# Patient Record
Sex: Male | Born: 1990 | Race: Black or African American | Hispanic: No | Marital: Single | Smoking: Current every day smoker
Health system: Southern US, Community
[De-identification: ages and names within clinical notes are randomized; demographics above are authoritative.]

## PROBLEM LIST (undated history)

## (undated) HISTORY — PX: APPENDECTOMY: SHX54

---

## 2011-02-08 ENCOUNTER — Emergency Department (HOSPITAL_COMMUNITY)
Admission: EM | Admit: 2011-02-08 | Discharge: 2011-02-09 | Disposition: A | Discharge status: Home / Self Care | Payer: Self-pay | Attending: Emergency Medicine | Admitting: Emergency Medicine

## 2011-02-08 DIAGNOSIS — IMO0002 Reserved for concepts with insufficient information to code with codable children: Secondary | ICD-10-CM | POA: Insufficient documentation

## 2011-02-08 DIAGNOSIS — S61409A Unspecified open wound of unspecified hand, initial encounter: Secondary | ICD-10-CM | POA: Insufficient documentation

## 2011-02-08 DIAGNOSIS — W268XXA Contact with other sharp object(s), not elsewhere classified, initial encounter: Secondary | ICD-10-CM | POA: Insufficient documentation

## 2012-05-23 ENCOUNTER — Ambulatory Visit (INDEPENDENT_AMBULATORY_CARE_PROVIDER_SITE_OTHER): Payer: 59 | Admitting: General Surgery

## 2013-04-10 ENCOUNTER — Emergency Department (HOSPITAL_COMMUNITY)
Admission: EM | Admit: 2013-04-10 | Discharge: 2013-04-10 | Disposition: A | Discharge status: Home / Self Care | Payer: 59 | Attending: Emergency Medicine | Admitting: Emergency Medicine

## 2013-04-10 ENCOUNTER — Encounter (HOSPITAL_COMMUNITY): Payer: Self-pay | Admitting: Emergency Medicine

## 2013-04-10 DIAGNOSIS — D1739 Benign lipomatous neoplasm of skin and subcutaneous tissue of other sites: Secondary | ICD-10-CM | POA: Insufficient documentation

## 2013-04-10 DIAGNOSIS — D1730 Benign lipomatous neoplasm of skin and subcutaneous tissue of unspecified sites: Secondary | ICD-10-CM

## 2013-04-10 NOTE — ED Provider Notes (Signed)
CSN: [id number]     Arrival date & time 04/10/13  1304 History  This chart was scribed for Nathan Finner, PA working with Ward Givens, MD by Quintella Reichert, ED Scribe. This patient was seen in room WTR7/WTR7 and the patient's care was started at 3:19 PM.    Chief Complaint  Patient presents with  . Cyst    The history is provided by the patient. No language interpreter was used.    HPI Comments: Nathan Moreno is a 22 y.o. male with no chronic medical conditions who presents to the Emergency Department complaining of a cyst near his left collar bone that his been present for one year.  The cyst is described as a dime-sized soft bump.  It is not painful.  He denies weight loss, fever, pain on swallowing, or any other associated symptoms.  There are no aggravating or alleviating factors.   History reviewed. No pertinent past medical history.   History reviewed. No pertinent past surgical history.   No family history on file.   History  Substance Use Topics  . Smoking status: Not on file  . Smokeless tobacco: Not on file  . Alcohol Use: Not on file     Review of Systems  Constitutional: Negative for fever and unexpected weight change.  HENT: Negative for sore throat and trouble swallowing.   Musculoskeletal:       Cyst  All other systems reviewed and are negative.      Allergies  Review of patient's allergies indicates no known allergies.  Home Medications  No current outpatient prescriptions on file.  BP 114/69  Pulse 65  Temp(Src) 97.5 F (36.4 C) (Oral)  Resp 20  SpO2 100%  Physical Exam  Nursing note and vitals reviewed. Constitutional: He is oriented to person, place, and time. He appears well-developed and well-nourished.  HENT:  Head: Normocephalic and atraumatic.  Eyes: EOM are normal.  Neck: Normal range of motion.  Cardiovascular: Normal rate.   Pulmonary/Chest: Effort normal.  Musculoskeletal: Normal range of motion.  1-cm mobile lesion  over left clavicle  Neurological: He is alert and oriented to person, place, and time.  Skin: Skin is warm and dry.  Psychiatric: He has a normal mood and affect. His behavior is normal.    ED Course  Procedures (including critical care time)  DIAGNOSTIC STUDIES: Oxygen Saturation is 100% on room air, normal by my interpretation.    COORDINATION OF CARE: 3:22 PM-Informed pt that symptoms are not cause for concern and no treatment is indicated.  Advised return precautions.  Pt expressed understanding.   Labs Reviewed - No data to display No results found. 1. Lipoma of skin     MDM  Lump on pt's proximal clavicle characteristic for lipoma.  Not concerned for infection, low concern for malignant mass.  Advised pt to watch for any changes in mass including pain, redness, or size.  Will discharge pt home and have him f/u with [facility] info provided. Return precautions given. Pt verbalized understanding and agreement with tx plan. Vitals: unremarkable. Discharged in stable condition.    I personally performed the services described in this documentation, which was scribed in my presence. The recorded information has been reviewed and is accurate.   Nathan Finner, PA-C 04/13/13 1622

## 2013-04-10 NOTE — ED Notes (Signed)
Pt has had a lump/ knot to lt side of collar bone for the past year now. Denies any pain states that since he was here with someone he wanted to have it looked at. Knot is soft and is dime size.

## 2013-04-15 NOTE — ED Provider Notes (Signed)
Medical screening examination/treatment/procedure(s) were performed by non-physician practitioner and as supervising physician I was immediately available for consultation/collaboration. Marjoria Mancillas, MD, FACEP   Donney Caraveo L Bianey Tesoro, MD 04/15/13 1509 

## 2013-09-13 ENCOUNTER — Encounter (HOSPITAL_BASED_OUTPATIENT_CLINIC_OR_DEPARTMENT_OTHER): Payer: Self-pay | Admitting: Emergency Medicine

## 2013-09-13 ENCOUNTER — Emergency Department (HOSPITAL_BASED_OUTPATIENT_CLINIC_OR_DEPARTMENT_OTHER)
Admission: EM | Admit: 2013-09-13 | Discharge: 2013-09-13 | Disposition: A | Discharge status: Home / Self Care | Payer: 59 | Attending: Emergency Medicine | Admitting: Emergency Medicine

## 2013-09-13 ENCOUNTER — Emergency Department (HOSPITAL_BASED_OUTPATIENT_CLINIC_OR_DEPARTMENT_OTHER): Payer: 59

## 2013-09-13 DIAGNOSIS — S62309A Unspecified fracture of unspecified metacarpal bone, initial encounter for closed fracture: Secondary | ICD-10-CM | POA: Insufficient documentation

## 2013-09-13 MED ORDER — HYDROCODONE-ACETAMINOPHEN 5-325 MG PO TABS: 1.0000 | ORAL_TABLET | Freq: Four times a day (QID) | ORAL | Status: DC | PRN

## 2013-09-13 MED ORDER — IBUPROFEN 800 MG PO TABS: 800.0000 mg | ORAL_TABLET | Freq: Three times a day (TID) | ORAL | Status: DC | PRN

## 2013-09-13 MED ORDER — IBUPROFEN 800 MG PO TABS
800.0000 mg | ORAL_TABLET | Freq: Once | ORAL | Status: AC
  Administered 2013-09-13: 800 mg via ORAL
  Filled 2013-09-13: qty 1
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
## 2013-09-13 NOTE — ED Notes (Signed)
Patient transported to X-ray via stretcher per tech. 
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
## 2013-09-13 NOTE — Discharge Instructions (Signed)
Cast or Splint Care Casts and splints support injured limbs and keep bones from moving while they heal.  HOME CARE  Keep the cast or splint uncovered during the drying period.  A plaster cast can take 24 to 48 hours to dry.  A fiberglass cast will dry in less than 1 hour.  Do not rest the cast on anything harder than a pillow for 24 hours.  Do not put weight on your injured limb. Do not put pressure on the cast. Wait for your doctor's approval.  Keep the cast or splint dry.  Cover the cast or splint with a plastic bag during baths or wet weather.  If you have a cast over your chest and belly (trunk), take sponge baths until the cast is taken off.  If your cast gets wet, dry it with a towel or blow dryer. Use the cool setting on the blow dryer.  Keep your cast or splint clean. Wash a dirty cast with a damp cloth.  Do not put any objects under your cast or splint.  Do not scratch the skin under the cast with an object. If itching is a problem, use a blow dryer on a cool setting over the itchy area.  Do not trim or cut your cast.  Do not take out the padding from inside your cast.  Exercise your joints near the cast as told by your doctor.  Raise (elevate) your injured limb on 1 or 2 pillows for the first 1 to 3 days. GET HELP IF:  Your cast or splint cracks.  Your cast or splint is too tight or too loose.  You itch badly under the cast.  Your cast gets wet or has a soft spot.  You have a bad smell coming from the cast.  You get an object stuck under the cast.  Your skin around the cast becomes red or sore.  You have new or more pain after the cast is put on. GET HELP RIGHT AWAY IF:  You have fluid leaking through the cast.  You cannot move your fingers or toes.  Your fingers or toes turn blue or white or are cool, painful, or puffy (swollen).  You have tingling or lose feeling (numbness) around the injured area.  You have bad pain or pressure under the  cast.  You have trouble breathing or have shortness of breath.  You have chest pain. Document Released: 12/03/2010 Document Revised: 04/05/2013 Document Reviewed: 02/09/2013 [facility] Patient Information 2014 [place].  Hand Fracture Your caregiver has diagnosed you with a fractured (broken) bone in your hand. If the bones are in good position and the hand is properly immobilized and rested, these injuries will usually heal in 3 to 6 weeks. A cast, splint, or bulky bandage is usually applied to keep the fracture site from moving. Do not remove the splint or cast until your caregiver approves. If the fracture is unstable or the bones are not aligned properly, surgery may be needed. Keep your hand raised (elevated) above the level of your heart as much as possible for the next 2 to 3 days until the swelling and pain are better. Apply ice packs for 15-20 minutes every 3 to 4 hours to help control the pain and swelling. See your caregiver or an orthopedic specialist as directed for follow-up care to make sure the fracture is beginning to heal properly. SEEK IMMEDIATE MEDICAL CARE IF:   You notice your fingers are cold, numb, crooked, or the pain  of your injury is severe.  You are not improving or seem to be getting worse.  You have questions or concerns. Document Released: 09/10/2004 Document Revised: 10/26/2011 Document Reviewed: 01/29/2009 [facility] Patient Information 2014 [place].

## 2013-09-13 NOTE — ED Notes (Signed)
MD at bedside. 

## 2015-04-08 ENCOUNTER — Encounter (HOSPITAL_BASED_OUTPATIENT_CLINIC_OR_DEPARTMENT_OTHER): Payer: Self-pay

## 2015-04-08 ENCOUNTER — Emergency Department (HOSPITAL_BASED_OUTPATIENT_CLINIC_OR_DEPARTMENT_OTHER)
Admission: EM | Admit: 2015-04-08 | Discharge: 2015-04-08 | Disposition: A | Discharge status: Home / Self Care | Payer: 59 | Attending: Emergency Medicine | Admitting: Emergency Medicine

## 2015-04-08 DIAGNOSIS — B349 Viral infection, unspecified: Secondary | ICD-10-CM | POA: Diagnosis not present

## 2015-04-08 DIAGNOSIS — R05 Cough: Secondary | ICD-10-CM | POA: Diagnosis present

## 2015-04-08 MED ORDER — BENZONATATE 100 MG PO CAPS: 200.0000 mg | ORAL_CAPSULE | Freq: Two times a day (BID) | ORAL | Status: DC | PRN

## 2015-04-08 MED ORDER — CETIRIZINE-PSEUDOEPHEDRINE ER 5-120 MG PO TB12: 1.0000 | ORAL_TABLET | Freq: Two times a day (BID) | ORAL | Status: DC

## 2015-04-08 NOTE — ED Notes (Signed)
Pt verbalizes understanding of d/c instructions and denies any further needs at this time. 

## 2015-04-08 NOTE — ED Notes (Signed)
C/o coughing x 2 weeks

## 2015-04-08 NOTE — ED Provider Notes (Signed)
CSN: [id number]     Arrival date & time 04/08/15  1429 History   First MD Initiated Contact with Patient 04/08/15 1454     Chief Complaint  Patient presents with  . Cough     (Consider location/radiation/quality/duration/timing/severity/associated sxs/prior Treatment) HPI Comments: Pt is a 24 yo male who presents to the ED with complaint of cough, onset 2 weeks. Pt endorses frontal HA, rhinorrhea, nausea, diarrhea. He notes he has had an intermittent productive cough with green/yellow sputum. Denies fever, visual changes, sore throat, dyspnea, wheezing, CP, abdominal pain, N/V, weakness, numbness, tingling. Pt reports that he has been taking multiple OTC meds and trying home remedies without relief. He notes he went to an urgent care clinic last week and was given a prescription (pt cant remember what the med was) which caused him to have diarrhea and as a result he stopped taking the medication. Denies any recent sick contacts.   Patient is a 24 y.o. male presenting with cough.  Cough Associated symptoms: headaches and rhinorrhea     History reviewed. No pertinent past medical history. Past Surgical History  Procedure Laterality Date  . Appendectomy     No family history on file. Social History  Substance Use Topics  . Smoking status: Never Smoker   . Smokeless tobacco: None  . Alcohol Use: Yes    Review of Systems  HENT: Positive for rhinorrhea.   Respiratory: Positive for cough.   Gastrointestinal: Positive for nausea and diarrhea.  Neurological: Positive for headaches.  All other systems reviewed and are negative.     Allergies  Review of patient's allergies indicates no known allergies.  Home Medications   Prior to Admission medications   Not on File   BP 125/78 mmHg  Pulse 54  Temp(Src) 98.7 F (37.1 C) (Oral)  Resp 16  Ht 5\' 5"  (1.651 m)  Wt 190 lb (86.183 kg)  BMI 31.62 kg/m2  SpO2 100% Physical Exam  Constitutional: He is oriented to person, place,  and time. He appears well-developed and well-nourished.  Non-toxic appearing.  HENT:  Head: Normocephalic and atraumatic.  Right Ear: Tympanic membrane and external ear normal.  Left Ear: Tympanic membrane and external ear normal.  Nose: Nose normal. Right sinus exhibits no maxillary sinus tenderness and no frontal sinus tenderness. Left sinus exhibits no maxillary sinus tenderness and no frontal sinus tenderness.  Mouth/Throat: Oropharynx is clear and moist. No oropharyngeal exudate.  Eyes: Conjunctivae and EOM are normal. Pupils are equal, round, and reactive to light. Right eye exhibits no discharge. Left eye exhibits no discharge. No scleral icterus.  Neck: Normal range of motion. Neck supple.  Cardiovascular: Normal rate, regular rhythm, normal heart sounds and intact distal pulses.   Pulmonary/Chest: Effort normal and breath sounds normal. He has no wheezes. He has no rales. He exhibits no tenderness.  Abdominal: Soft. Bowel sounds are normal. He exhibits no mass. There is no tenderness. There is no rebound and no guarding.  Musculoskeletal: Normal range of motion. He exhibits no edema.  Lymphadenopathy:    He has no cervical adenopathy.  Neurological: He is alert and oriented to person, place, and time.  Skin: Skin is warm and dry.  Vitals reviewed.   ED Course  Procedures (including critical care time) Labs Review Labs Reviewed - No data to display  Imaging Review No results found. I have personally reviewed and evaluated these images and lab results as part of my medical decision-making.  Filed Vitals:   04/08/15 1436  BP: 125/78  Pulse: 54  Temp: 98.7 F (37.1 C)  Resp: 16    Meds given in ED:  Medications - No data to display  Discharge Medication List as of 04/08/2015  3:49 PM    START taking these medications   Details  benzonatate (TESSALON) 100 MG capsule Take 2 capsules (200 mg total) by mouth 2 (two) times daily as needed for cough., Starting 04/08/2015,  Until Discontinued, Print    cetirizine-pseudoephedrine (ZYRTEC-D) 5-120 MG per tablet Take 1 tablet by mouth 2 (two) times daily., Starting 04/08/2015, Until Discontinued, Print         MDM   Final diagnoses:  Viral syndrome    Pt presents with cough, HA, nausea and diarrhea. HA, nausea and diarrhea have resolved. Vitals stable, no fever. No sinus tenderness, no postnasal drainage noted on exam. Lungs clear bilaterally, I do not feel the need to order a xray at this time. Symptoms likely due to viral illness. I will give pt rx for Tessalon and pseudoephedrine for sxs. Discussed plan for d/c with pt. Pt advised to take medications as prescribed and to return to ED if sxs worsen. Pt agrees.    Chesley Noon Lyons, [place] 04/08/15 1555  Carmin Muskrat, MD 04/10/15 [phone]

## 2015-04-11 ENCOUNTER — Encounter (HOSPITAL_COMMUNITY): Payer: Self-pay | Admitting: *Deleted

## 2015-04-11 ENCOUNTER — Emergency Department (HOSPITAL_COMMUNITY)
Admission: EM | Admit: 2015-04-11 | Discharge: 2015-04-11 | Disposition: A | Discharge status: Home / Self Care | Payer: 59 | Attending: Emergency Medicine | Admitting: Emergency Medicine

## 2015-04-11 DIAGNOSIS — Z79899 Other long term (current) drug therapy: Secondary | ICD-10-CM | POA: Diagnosis not present

## 2015-04-11 DIAGNOSIS — H578 Other specified disorders of eye and adnexa: Secondary | ICD-10-CM | POA: Diagnosis present

## 2015-04-11 DIAGNOSIS — Z720 Tobacco use: Secondary | ICD-10-CM | POA: Insufficient documentation

## 2015-04-11 DIAGNOSIS — H01005 Unspecified blepharitis left lower eyelid: Secondary | ICD-10-CM

## 2015-04-11 MED ORDER — ERYTHROMYCIN 5 MG/GM OP OINT: 1.0000 "application " | TOPICAL_OINTMENT | Freq: Four times a day (QID) | OPHTHALMIC | Status: DC

## 2015-04-11 NOTE — ED Notes (Signed)
Pt c/o eye swelling since Tues; pt states that his eye was itching on Tues but none since; pt with swelling to left lower lid; no redness noted

## 2015-04-11 NOTE — ED Provider Notes (Signed)
CSN: [id number]     Arrival date & time 04/11/15  2142 History  This chart was scribed for Antonietta Breach, PA-C, working with Orlie Dakin, MD by Steva Colder, ED Scribe. The patient was seen in room WTR8/WTR8 at 10:19 PM.     Chief Complaint  Patient presents with  . Stye    The history is provided by the patient. No language interpreter was used.    HPI Comments: Nathan Moreno is a 24 y.o. male who presents to the Emergency Department complaining of left lower lid eye swelling onset 3 AM this morning. Pt reports that his eye swelling is with associated left eye itching.  Pt notes that he is still able to see but it is decreased due to the lower left eye swelling. He states that he is having associated symptoms of watery left eye and left eye pain. He states that he has tried OTC visine, OTC stye medications, and warm compresses for the relief for his symptoms. He denies eye redness, left eye drainage, vision loss, and any other symptoms. 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 Pt denies wearing contacts or glasses.   History reviewed. No pertinent past medical history. Past Surgical History  Procedure Laterality Date  . Appendectomy     No family history on file. Social History  Substance Use Topics  . Smoking status: Current Every Day Smoker    Types: Cigars  . Smokeless tobacco: None  . Alcohol Use: Yes     Comment: socially    Review of Systems  Eyes: Positive for itching. Negative for discharge, redness and visual disturbance.       Watery left eye with left eye swelling  All other systems reviewed and are negative.   Allergies  Review of patient's allergies indicates no known allergies.  Home Medications   Prior to Admission medications   Medication Sig Start Date End Date Taking? Authorizing Provider  benzonatate (TESSALON) 100 MG capsule Take 2 capsules (200 mg total) by mouth 2 (two) times daily as needed for cough. 04/08/15  Yes Chesley Noon Nadeau, PA-C   cetirizine-pseudoephedrine (ZYRTEC-D) 5-120 MG per tablet Take 1 tablet by mouth 2 (two) times daily. 04/08/15  Yes Chesley Noon Nadeau, PA-C  naphazoline-pheniramine (NAPHCON-A) 0.025-0.3 % ophthalmic solution Place 2 drops into both eyes 4 (four) times daily as needed for irritation.   Yes Historical Provider, MD  oxymetazoline (AFRIN) 0.05 % nasal spray Place 1 spray into both nostrils 2 (two) times daily.   Yes Historical Provider, MD  White Petrolatum-Mineral Oil (STYE OP) Apply 1 application to eye daily.   Yes Historical Provider, MD  erythromycin ophthalmic ointment Place 1 application into the left eye every 6 (six) hours. Place 1/2 inch ribbon of ointment in the affected eye 4 times a day 04/11/15   Antonietta Breach, PA-C   BP 141/70 mmHg  Pulse 62  Temp(Src) 98.2 F (36.8 C) (Oral)  Resp 20  SpO2 100%   Physical Exam  Constitutional: He is oriented to person, place, and time. He appears well-developed and well-nourished. No distress.  Nontoxic/nonseptic appearing  HENT:  Head: Normocephalic and atraumatic.  Eyes: Conjunctivae and EOM are normal. Pupils are equal, round, and reactive to light. Lids are everted and swept, no foreign bodies found. Left eye exhibits no hordeolum. Right conjunctiva is not injected. Right conjunctiva has no hemorrhage. Left conjunctiva is not injected. Left conjunctiva has no hemorrhage. No scleral icterus.    L lower lid blepharitis. No stye  or chalazion seen. No conjunctival injection. PERRL. Patient denies FB sensation. EOMs normal; no nystagmus. Snellen 20/13 OS, OD, OU  Neck: Normal range of motion.  No nuchal rigidity or meningismus.  Pulmonary/Chest: Effort normal. No respiratory distress.  Musculoskeletal: Normal range of motion.  Neurological: He is alert and oriented to person, place, and time.  Skin: Skin is warm and dry. No rash noted. He is not diaphoretic. No erythema. No pallor.  Psychiatric: He has a normal mood and affect. His  behavior is normal.  Nursing note and vitals reviewed.   ED Course  Procedures (including critical care time) DIAGNOSTIC STUDIES: Oxygen Saturation is 100% on RA, nl by my interpretation.    COORDINATION OF CARE: 10:30 PM Discussed treatment plan with pt at bedside and pt agreed to plan.   Labs Review Labs Reviewed - No data to display  Imaging Review No results found.   I have personally reviewed and evaluated these images and lab results as part of my medical decision-making.   EKG Interpretation None      MDM   Final diagnoses:  Blepharitis of left lower eyelid    24 year old male presents to the emergency department for symptoms consistent with a left lower lid blepharitis. No hx of trauma/injury to eye. No conjunctival injection. Visual acuity intact. No foreign body visualized. Unable to visualize a stye or chalazion to explain symptoms today. No underlying concern for dacryocystitis or preseptal/septal cellulitis. Will cover with erythromycin ointment and referred to ophthalmology for follow-up. Return precautions given at discharge. Patient agreeable to plan with no unaddressed concerns. Patient discharged in good condition.  I personally performed the services described in this documentation, which was scribed in my presence. The recorded information has been reviewed and is accurate.   Filed Vitals:   04/11/15 2156  BP: 141/70  Pulse: 62  Temp: 98.2 F (36.8 C)  TempSrc: Oral  Resp: 20  SpO2: 100%      Antonietta Breach, PA-C 04/11/15 2244  Orlie Dakin, MD 04/11/15 [phone]

## 2015-04-11 NOTE — Discharge Instructions (Signed)
Blepharitis Blepharitis is redness, soreness, and swelling (inflammation) of one or both eyelids. It may be caused by an allergic reaction or a bacterial infection. Blepharitis may also be associated with reddened, scaly skin (seborrhea) of the scalp and eyebrows. While you sleep, eye discharge may cause your eyelashes to stick together. Your eyelids may itch, burn, swell, and may lose their lashes. These will grow back. Your eyes may become sensitive. Blepharitis may recur and need repeated treatment. If this is the case, you may require further evaluation by an eye specialist (ophthalmologist). HOME CARE INSTRUCTIONS   Keep your hands clean.  Use a clean towel each time you dry your eyelids. Do not use this towel to clean other areas. Do not share a towel or makeup with anyone.  Wash your eyelids with warm water or warm water mixed with a small amount of baby shampoo. Do this twice a day or as often as needed.  Wash your face and eyebrows at least once a day.  Use warm compresses 2 times a day for 10 minutes at a time, or as directed by your caregiver.  Apply antibiotic ointment as directed by your caregiver.  Avoid rubbing your eyes.  Avoid wearing makeup until you get better.  Follow up with your caregiver as directed. SEEK IMMEDIATE MEDICAL CARE IF:   You have pain, redness, or swelling that gets worse or spreads to other parts of your face.  Your vision changes, or you have pain when looking at lights or moving objects.  You have a fever.  Your symptoms continue for longer than 2 to 4 days or become worse. MAKE SURE YOU:   Understand these instructions.  Will watch your condition.  Will get help right away if you are not doing well or get worse. Document Released: 07/31/2000 Document Revised: 10/26/2011 Document Reviewed: 09/10/2010 Exit[employer] Patient Information 2015 [employer]. This information is not intended to replace advice given to you by your health care  provider. Make sure you discuss any questions you have with your health care provider.  

## 2015-04-11 NOTE — ED Notes (Signed)
Pt is ambulatory and a&ox4. Questions r/t dc were denied.

## 2015-07-09 ENCOUNTER — Emergency Department (HOSPITAL_BASED_OUTPATIENT_CLINIC_OR_DEPARTMENT_OTHER)
Admission: EM | Admit: 2015-07-09 | Discharge: 2015-07-09 | Disposition: A | Discharge status: Home / Self Care | Payer: 59 | Attending: Emergency Medicine | Admitting: Emergency Medicine

## 2015-07-09 ENCOUNTER — Encounter (HOSPITAL_BASED_OUTPATIENT_CLINIC_OR_DEPARTMENT_OTHER): Payer: Self-pay | Admitting: Emergency Medicine

## 2015-07-09 ENCOUNTER — Emergency Department (HOSPITAL_BASED_OUTPATIENT_CLINIC_OR_DEPARTMENT_OTHER): Payer: 59

## 2015-07-09 DIAGNOSIS — R05 Cough: Secondary | ICD-10-CM | POA: Diagnosis present

## 2015-07-09 DIAGNOSIS — F17210 Nicotine dependence, cigarettes, uncomplicated: Secondary | ICD-10-CM | POA: Diagnosis not present

## 2015-07-09 DIAGNOSIS — R1110 Vomiting, unspecified: Secondary | ICD-10-CM | POA: Insufficient documentation

## 2015-07-09 DIAGNOSIS — Z79899 Other long term (current) drug therapy: Secondary | ICD-10-CM | POA: Insufficient documentation

## 2015-07-09 DIAGNOSIS — J209 Acute bronchitis, unspecified: Secondary | ICD-10-CM | POA: Insufficient documentation

## 2015-07-09 MED ORDER — DOXYCYCLINE HYCLATE 100 MG PO TABS
100.0000 mg | ORAL_TABLET | Freq: Once | ORAL | Status: AC
  Administered 2015-07-09: 100 mg via ORAL
  Filled 2015-07-09: qty 1

## 2015-07-09 MED ORDER — ALBUTEROL SULFATE HFA 108 (90 BASE) MCG/ACT IN AERS
2.0000 | INHALATION_SPRAY | RESPIRATORY_TRACT | Status: DC | PRN
  Administered 2015-07-09: 2 via RESPIRATORY_TRACT
  Filled 2015-07-09: qty 6.7

## 2015-07-09 MED ORDER — IPRATROPIUM-ALBUTEROL 0.5-2.5 (3) MG/3ML IN SOLN
RESPIRATORY_TRACT | Status: AC
  Administered 2015-07-09: 3 mL via RESPIRATORY_TRACT
  Filled 2015-07-09: qty 3

## 2015-07-09 MED ORDER — IPRATROPIUM-ALBUTEROL 0.5-2.5 (3) MG/3ML IN SOLN
3.0000 mL | RESPIRATORY_TRACT | Status: DC
  Administered 2015-07-09: 3 mL via RESPIRATORY_TRACT

## 2015-07-09 MED ORDER — DEXAMETHASONE SODIUM PHOSPHATE 10 MG/ML IJ SOLN
10.0000 mg | Freq: Once | INTRAMUSCULAR | Status: AC
  Administered 2015-07-09: 10 mg via INTRAMUSCULAR
  Filled 2015-07-09: qty 1

## 2015-07-09 MED ORDER — DOXYCYCLINE HYCLATE 100 MG PO CAPS: 100.0000 mg | ORAL_CAPSULE | Freq: Two times a day (BID) | ORAL | Status: DC

## 2015-07-09 MED ORDER — HYDROCOD POLST-CPM POLST ER 10-8 MG/5ML PO SUER: 5.0000 mL | Freq: Two times a day (BID) | ORAL | Status: DC | PRN

## 2015-07-09 MED ORDER — HYDROCOD POLST-CPM POLST ER 10-8 MG/5ML PO SUER
5.0000 mL | Freq: Once | ORAL | Status: AC
  Administered 2015-07-09: 5 mL via ORAL
  Filled 2015-07-09: qty 5

## 2015-07-09 NOTE — ED Notes (Addendum)
Patient c/o cough with greenish, thick mucous. Patient states cough has been ongoing for 3 weeks. Patient family member reports patient mucous PTA was streaked with blood. Patient has been taking Mucinex, benadryl, tylenol, Theraflu, and nyquil at home without significant relief. Patient states the blood in his mucous was what made him come in tonight, along with taking the medications the recommended max days dose without evaluation. Patient states he has not previously seen a DR for this problem. 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 Patient seen in August for cough, states this cough is worse than he had at that time.

## 2015-07-09 NOTE — ED Provider Notes (Signed)
CSN: [id number]     Arrival date & time 07/09/15  0149 History   First MD Initiated Contact with Patient 07/09/15 [phone]     Chief Complaint  Patient presents with  . Cough     (Consider location/radiation/quality/duration/timing/severity/associated sxs/prior Treatment) HPI  This is a 24 year old male with a two-week history of cough. The cough is productive of green sputum. The sputum became blood-streaked yesterday. His cough has been severe enough to cause posttussive emesis. It is exacerbated by deep breaths. It is worse at night. He states his chest hurts as a result of coughing and he is experiencing soreness when he coughs or takes a deep breath. He has tried multiple over-the-counter medications without relief. Is not aware of having a fever but he was noted to have a temperature of 99 on arrival.  History reviewed. No pertinent past medical history. Past Surgical History  Procedure Laterality Date  . Appendectomy     History reviewed. No pertinent family history. Social History  Substance Use Topics  . Smoking status: Current Every Day Smoker    Types: Cigars  . Smokeless tobacco: None  . Alcohol Use: Yes     Comment: socially    Review of Systems  All other systems reviewed and are negative.   Allergies  Review of patient's allergies indicates no known allergies.  Home Medications   Prior to Admission medications   Medication Sig Start Date End Date Taking? Authorizing Provider  benzonatate (TESSALON) 100 MG capsule Take 2 capsules (200 mg total) by mouth 2 (two) times daily as needed for cough. 04/08/15   Nona Dell, PA-C  cetirizine-pseudoephedrine (ZYRTEC-D) 5-120 MG per tablet Take 1 tablet by mouth 2 (two) times daily. 04/08/15   Nona Dell, PA-C  erythromycin ophthalmic ointment Place 1 application into the left eye every 6 (six) hours. Place 1/2 inch ribbon of ointment in the affected eye 4 times a day 04/11/15   Antonietta Breach, PA-C   naphazoline-pheniramine (NAPHCON-A) 0.025-0.3 % ophthalmic solution Place 2 drops into both eyes 4 (four) times daily as needed for irritation.    Historical Provider, MD  oxymetazoline (AFRIN) 0.05 % nasal spray Place 1 spray into both nostrils 2 (two) times daily.    Historical Provider, MD  White Petrolatum-Mineral Oil (STYE OP) Apply 1 application to eye daily.    Historical Provider, MD   BP 141/67 mmHg  Pulse 104  Temp(Src) 99 F (37.2 C) (Oral)  Resp 22  Ht 5\' 7"  (D34-534 m)  Wt 207 lb (93.895 kg)  BMI 32.41 kg/m2  SpO2 98%   Physical Exam  General: Well-developed, well-nourished male in no acute distress; appearance consistent with age of record HENT: normocephalic; atraumatic; pharynx normal Eyes: pupils equal, round and reactive to light; extraocular muscles intact Neck: supple Heart: regular rate and rhythm Lungs: clear to auscultation bilaterally; persistent coughing Abdomen: soft; nondistended; nontender Extremities: No deformity; full range of motion; pulses normal Neurologic: Awake, alert and oriented; motor function intact in all extremities and symmetric; no facial droop Skin: Warm and dry Psychiatric: Flat affect    ED Course  Procedures (including critical care time)   MDM  Nursing notes and vitals signs, including pulse oximetry, reviewed.  Summary of this visit's results, reviewed by myself:  Imaging Studies: Dg Chest 2 View  07/09/2015  CLINICAL DATA:  Mid chest pain, cough, and shortness of breath for 1 week. Smoker. EXAM: CHEST  2 VIEW COMPARISON:  None. FINDINGS: The heart size and mediastinal  contours are within normal limits. Both lungs are clear. The visualized skeletal structures are unremarkable. IMPRESSION: No active cardiopulmonary disease. Electronically Signed   By: Lucienne Capers M.D.   On: 07/09/2015 02:22   2:39 AM Cough improved after DuoNeb treatment. Able to take deep breaths without triggering a paroxysm of cough.  Shanon Rosser,  MD 07/09/15 [phone]

## 2015-07-09 NOTE — Discharge Instructions (Signed)

## 2015-07-09 NOTE — ED Notes (Signed)
Patient transported to X-ray 

## 2016-03-26 IMAGING — DX DG CHEST 2V
2 series · 2 of 2 positions shown · non-contrast
Comparison: None.

CLINICAL DATA: Mid chest pain, cough, and shortness of breath for 1
week. Smoker.

EXAM:
CHEST  2 VIEW

[chest pa]
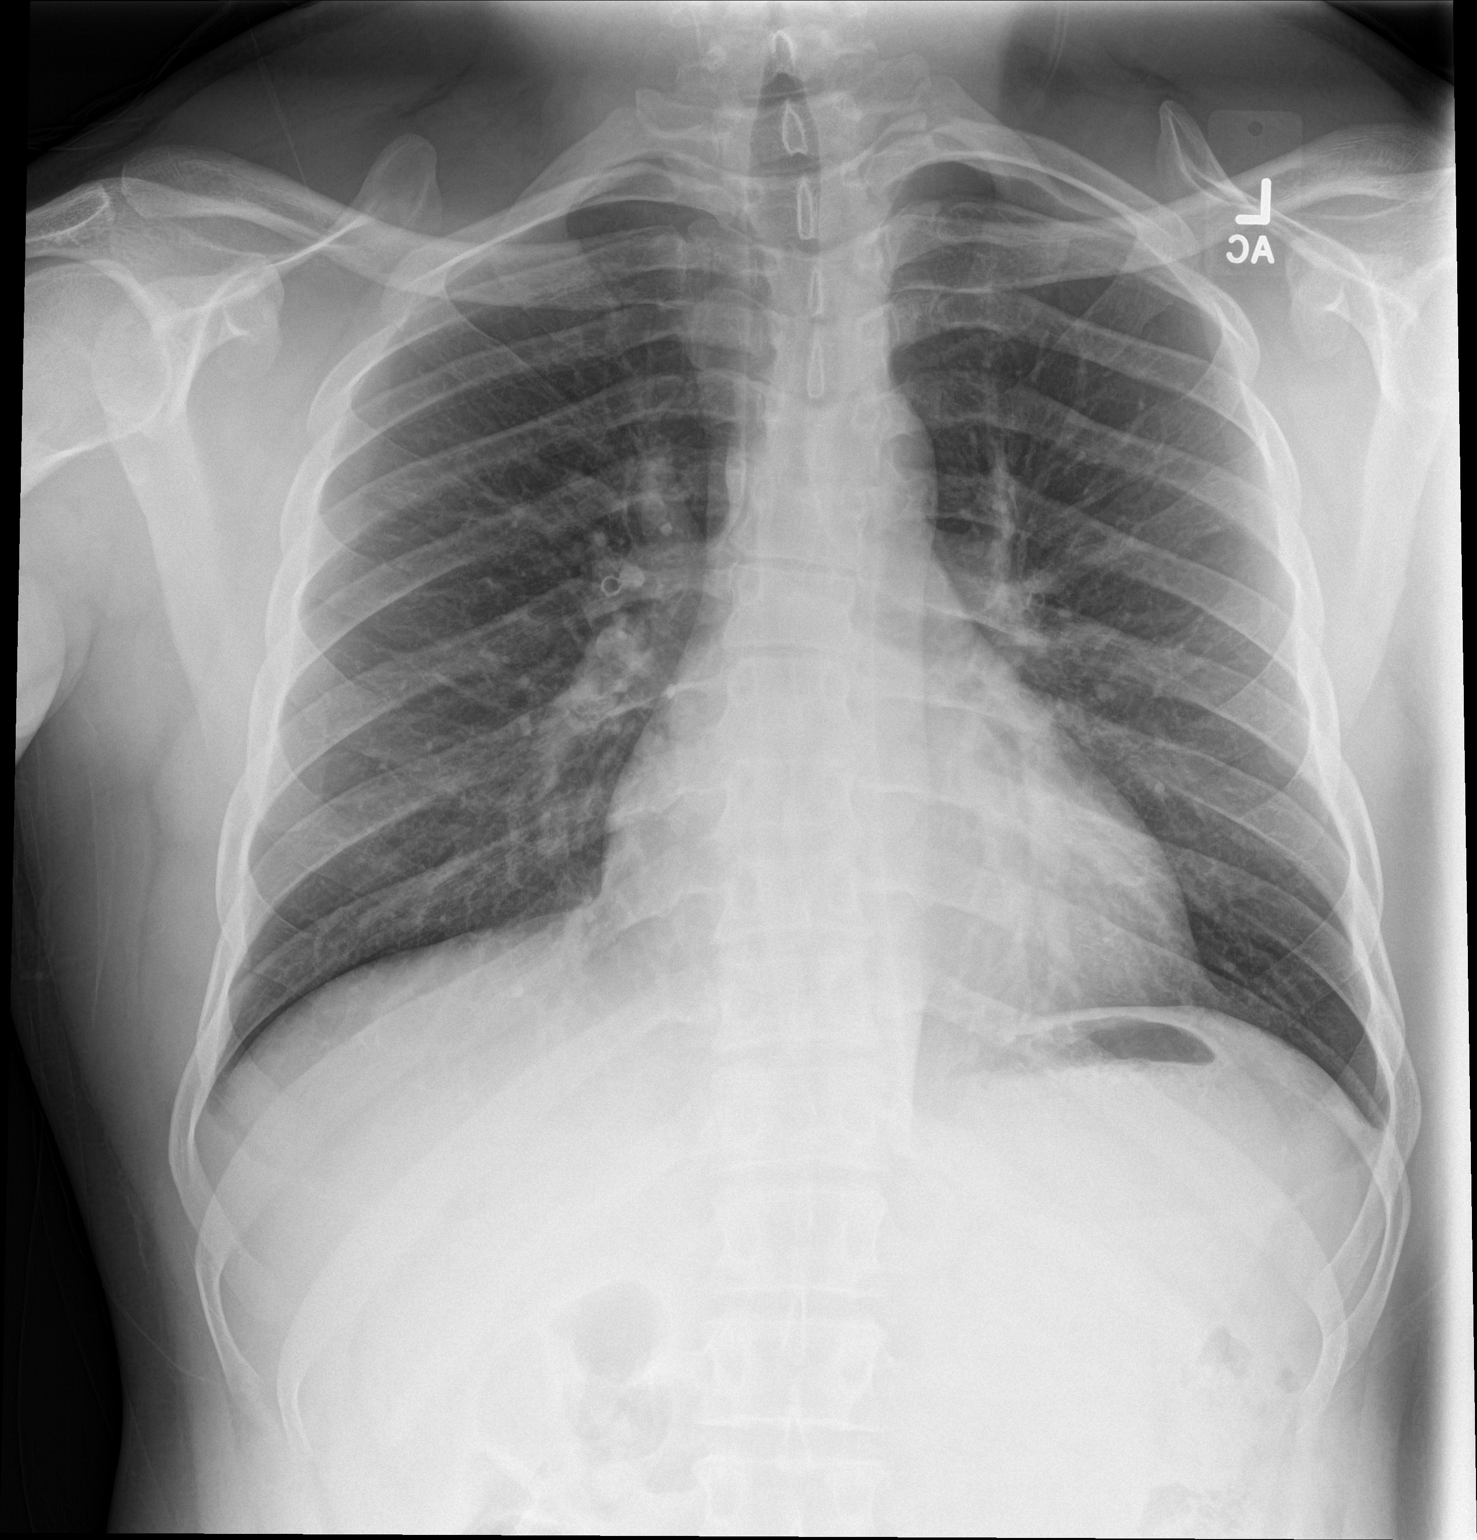

[chest lat]
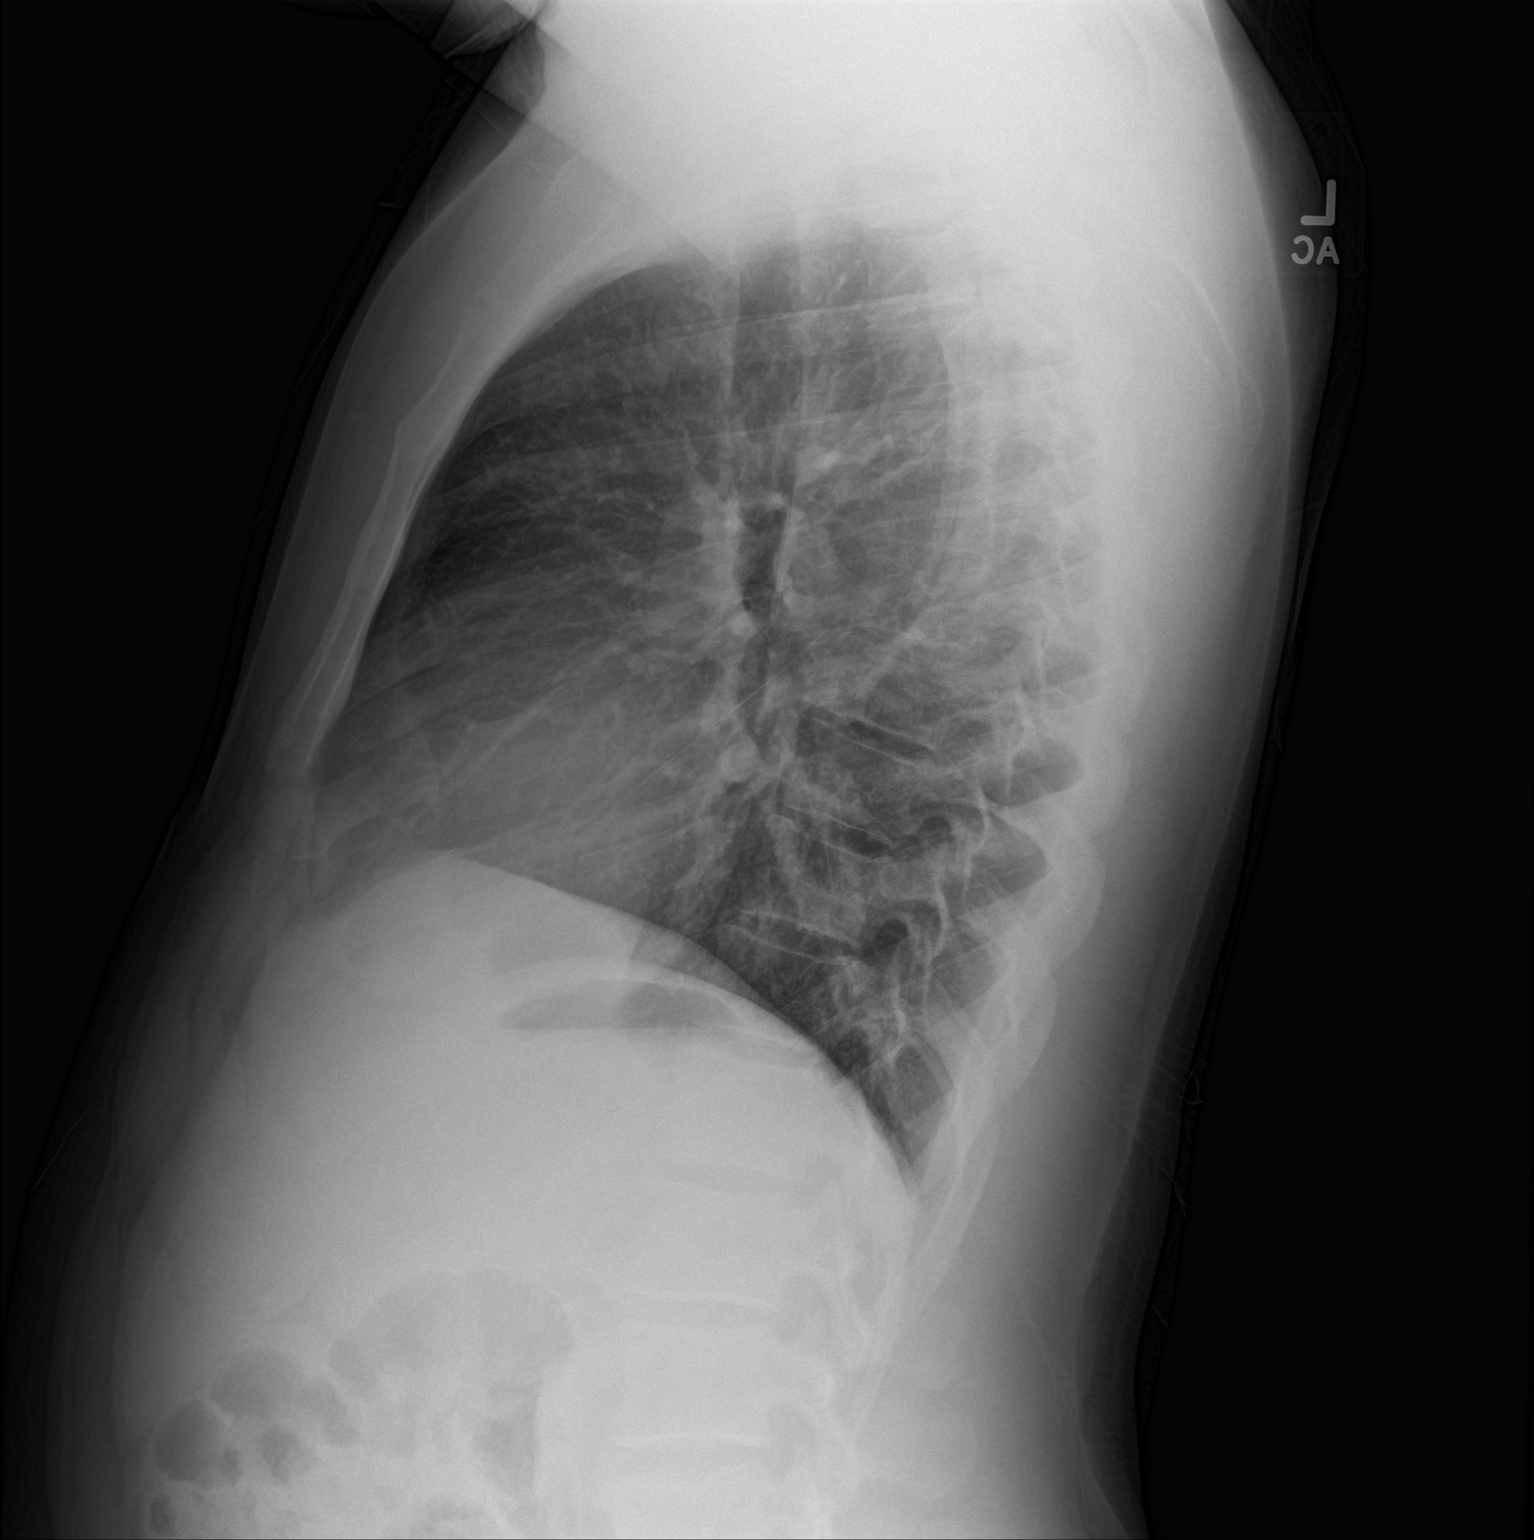

[2 of 2 positions shown; findings below may reference images not displayed]

FINDINGS: The heart size and mediastinal contours are within normal limits.
Both lungs are clear. The visualized skeletal structures are
unremarkable.
IMPRESSION: No active cardiopulmonary disease.

## 2018-04-08 ENCOUNTER — Ambulatory Visit: Payer: 59 | Admitting: Physician Assistant

## 2019-07-24 ENCOUNTER — Encounter: Payer: Self-pay | Admitting: Osteopathic Medicine

## 2019-07-24 ENCOUNTER — Other Ambulatory Visit: Payer: Self-pay

## 2019-07-24 ENCOUNTER — Ambulatory Visit (INDEPENDENT_AMBULATORY_CARE_PROVIDER_SITE_OTHER): Payer: 59 | Admitting: Osteopathic Medicine

## 2019-07-24 VITALS — BP 120/71 | HR 55 | Temp 98.1°F | Ht 67.0 in | Wt 210.1 lb

## 2019-07-24 DIAGNOSIS — Z113 Encounter for screening for infections with a predominantly sexual mode of transmission: Secondary | ICD-10-CM

## 2019-07-24 DIAGNOSIS — Z0000 Encounter for general adult medical examination without abnormal findings: Secondary | ICD-10-CM | POA: Diagnosis not present

## 2019-07-24 DIAGNOSIS — L989 Disorder of the skin and subcutaneous tissue, unspecified: Secondary | ICD-10-CM

## 2019-07-24 NOTE — Progress Notes (Signed)
HPI: Nathan Moreno is a 28 y.o. male who  has no past medical history on file.  he presents to [facility] today, 07/24/19,  for chief complaint of: New to establish care - needs check-up     Patient here for annual physical / wellness exam.  See preventive care reviewed as below.  Recent labs reviewed in detail with the patient.   Additional concerns today include:  Small bump on the scrotum he'd like checked out   Past medical, surgical, social and family history reviewed:  There are no active problems to display for this patient.   Past Surgical History:  Procedure Laterality Date  . APPENDECTOMY      Social History   Tobacco Use  . Smoking status: Current Every Day Smoker    Types: Cigars  . Smokeless tobacco: Never Used  . Tobacco comment: As per pt, smokes 1 - 2 cigs daily  Substance Use Topics  . Alcohol use: Yes    Comment: socially    Family History  Family history unknown: Yes     Current medication list and allergy/intolerance information reviewed:    No current outpatient medications on file.   No current facility-administered medications for this visit.     No Known Allergies    Review of Systems:  Constitutional:  No  fever, no chills, No recent illness, No unintentional weight changes. No significant fatigue.   HEENT: No  headache, no vision change, no hearing change, No sore throat, No  sinus pressure  Cardiac: No  chest pain, No  pressure, No palpitations, No  Orthopnea  Respiratory:  No  shortness of breath. No  Cough  Gastrointestinal: No  abdominal pain, No  nausea, No  vomiting,  No  blood in stool, No  diarrhea, No  constipation   Musculoskeletal: No new myalgia/arthralgia  Skin: No  Rash, +other wounds/concerning lesions  Genitourinary: No  incontinence, No  abnormal genital bleeding, No abnormal genital discharge  Hem/Onc: No  easy bruising/bleeding, No  abnormal lymph  node  Endocrine: No cold intolerance,  No heat intolerance. No polyuria/polydipsia/polyphagia   Neurologic: No  weakness, No  dizziness, No  slurred speech/focal weakness/facial droop  Psychiatric: No  concerns with depression, No  concerns with anxiety, No sleep problems, No mood problems  Exam:  BP 120/71 (BP Location: Left Arm, Patient Position: Sitting, Cuff Size: Normal)   Pulse (!) 55   Temp 98.1 F (36.7 C) (Oral)   Ht 5\' 7"  (1.702 m)   Wt 210 lb 1.9 oz (95.3 kg)   BMI 32.91 kg/m   Constitutional: VS see above. General Appearance: alert, well-developed, well-nourished, NAD  Eyes: Normal lids and conjunctive, non-icteric sclera  Ears, Nose, Mouth, Throat:  TM normal bilaterally.   Neck: No masses, trachea midline. No thyroid enlargement. No tenderness/mass appreciated. No lymphadenopathy  Respiratory: Normal respiratory effort. no wheeze, no rhonchi, no rales  Cardiovascular: S1/S2 normal, no murmur, no rub/gallop auscultated. RRR. No lower extremity edema.   Gastrointestinal: Nontender, no masses. No hepatomegaly, no splenomegaly. No hernia appreciated. Bowel sounds normal. Rectal exam deferred.   Musculoskeletal: Gait normal. No clubbing/cyanosis of digits.   Neurological: Normal balance/coordination. No tremor.  Skin: warm, dry, intact. No rash/ulcer. Small skin tag to L of base of penile shaft/pubic area  Psychiatric: Normal judgment/insight. Normal mood and affect. Oriented x3.    No results found for this or any previous visit (from the past 72 hour(s)).  No results found.  ASSESSMENT/PLAN: The primary encounter diagnosis was Annual physical exam. Diagnoses of Routine screening for STI (sexually transmitted infection) and Benign skin growth were also pertinent to this visit.   Offered cryotherapy to skin tag on penile area, t declined, it doesn't appear to be a wart, no concern for malignancy, I think we can monitor this  Otherwise doing well, no  concerns!   Orders Placed This Encounter  Procedures  . C. trachomatis/N. gonorrhoeae RNA  . Trichomonas vaginalis, RNA  . CBC  . COMPLETE METABOLIC PANEL WITH GFR  . LIPID SCREENING  . HIV SCREEN  . Hepatitis B core antibody, total  . Hepatitis B surface antigen  . RPR  . Hepatitis C Antibody    No orders of the defined types were placed in this encounter.   Patient Instructions  General Preventive Care  Most recent routine screening lipids/other labs: ordered today  Everyone should have blood pressure checked once per year.   Tobacco: don't! Please let me know if you need help quitting!  Alcohol: responsible moderation is ok for most adults - if you have concerns about   Exercise: as tolerated to reduce risk of cardiovascular disease and diabetes. Strength training will also prevent osteoporosis.   Mental health: if need for mental health care (medicines, counseling, other), or concerns about moods, please let me know!   Sexual health: if need for STD testing, or if concerns with libido/pain problems, please let me know! If you need to discuss your birth control options, please let me know!   Advanced Directive: Living Will and/or Healthcare Power of Attorney recommended for all adults, regardless of age or health.  Vaccines  Flu vaccine: recommended for almost everyone, every fall.   Shingles vaccine: Shingrix recommended after age 2.   Pneumonia vaccines: Prevnar and Pneumovax recommended after age 34  Tetanus booster: Tdap recommended every 10 years.   HPV vaccine: Gardasil recommended up to age 73 to prevent HPV-associated diseases, including certain cancers.  Cancer screenings   Colon cancer screening: recommended for everyone at age 62, but some folks need a colonoscopy sooner if risk factors   Prostate cancer screening: PSA blood test around age 57  Lung cancer screening: CT chest every year for those age 56 to 29 years old with ?30 pack year smoking  history, who either currently smoke or have quit within the past 15 years. Infection screenings . HIV: recommended screening at least once age 39-65, more often as needed. . Gonorrhea/Chlamydia: screening as needed. . Hepatitis C: recommended once for anyone born 38-1966 . TB: certain at-risk populations, or depending on work requirements and/or travel history Other . Bone Density Test: recommended for men at age 30, sooner depending on risk factors . Abdominal Aortic Aneurysm: screening with ultrasound recommended once for men age 43-75 who have ever smoked 100+ cigarettes        Visit summary with medication list and pertinent instructions was printed for patient to review. All questions at time of visit were answered - patient instructed to contact office with any additional concerns or updates. ER/RTC precautions were reviewed with the patient.    Please note: voice recognition software was used to produce this document, and typos may escape review. Please contact Dr. Sheppard Coil for any needed clarifications.     Follow-up plan: Return in about 1 year (around 07/23/2020) for [place] (call week prior to visit for lab orders).

## 2019-07-24 NOTE — Patient Instructions (Addendum)
General Preventive Care  Most recent routine screening lipids/other labs: ordered today  Everyone should have blood pressure checked once per year.   Tobacco: don't! Please let me know if you need help quitting!  Alcohol: responsible moderation is ok for most adults - if you have concerns about   Exercise: as tolerated to reduce risk of cardiovascular disease and diabetes. Strength training will also prevent osteoporosis.   Mental health: if need for mental health care (medicines, counseling, other), or concerns about moods, please let me know!   Sexual health: if need for STD testing, or if concerns with libido/pain problems, please let me know! If you need to discuss your birth control options, please let me know!   Advanced Directive: Living Will and/or Healthcare Power of Attorney recommended for all adults, regardless of age or health.  Vaccines  Flu vaccine: recommended for almost everyone, every fall.   Shingles vaccine: Shingrix recommended after age 2.   Pneumonia vaccines: Prevnar and Pneumovax recommended after age 15  Tetanus booster: Tdap recommended every 10 years.   HPV vaccine: Gardasil recommended up to age 5 to prevent HPV-associated diseases, including certain cancers.  Cancer screenings   Colon cancer screening: recommended for everyone at age 31, but some folks need a colonoscopy sooner if risk factors   Prostate cancer screening: PSA blood test around age 76  Lung cancer screening: CT chest every year for those age 71 to 29 years old with ?30 pack year smoking history, who either currently smoke or have quit within the past 15 years. Infection screenings . HIV: recommended screening at least once age 60-65, more often as needed. . Gonorrhea/Chlamydia: screening as needed. . Hepatitis C: recommended once for anyone born 63-1966 . TB: certain at-risk populations, or depending on work requirements and/or travel history Other . Bone Density Test:  recommended for men at age 68, sooner depending on risk factors . Abdominal Aortic Aneurysm: screening with ultrasound recommended once for men age 42-75 who have ever smoked 100+ cigarettes

## 2019-07-25 LAB — CBC
HCT: 42 % (ref 38.5–50.0)
Hemoglobin: 14.3 g/dL (ref 13.2–17.1)
MCH: 31 pg (ref 27.0–33.0)
MCHC: 34 g/dL (ref 32.0–36.0)
MCV: 91.1 fL (ref 80.0–100.0)
MPV: 10.7 fL (ref 7.5–12.5)
Platelets: 245 10*3/uL (ref 140–400)
RBC: 4.61 10*6/uL (ref 4.20–5.80)
RDW: 13.3 % (ref 11.0–15.0)
WBC: 5.2 10*3/uL (ref 3.8–10.8)

## 2019-07-25 LAB — C. TRACHOMATIS/N. GONORRHOEAE RNA
C. trachomatis RNA, TMA: NOT DETECTED
N. gonorrhoeae RNA, TMA: NOT DETECTED

## 2019-07-25 LAB — RPR: RPR Ser Ql: NONREACTIVE

## 2019-07-25 LAB — COMPLETE METABOLIC PANEL WITH GFR
AG Ratio: 1.7 (calc) (ref 1.0–2.5)
ALT: 37 U/L (ref 9–46)
AST: 28 U/L (ref 10–40)
Albumin: 4.8 g/dL (ref 3.6–5.1)
Alkaline phosphatase (APISO): 63 U/L (ref 36–130)
BUN: 13 mg/dL (ref 7–25)
CO2: 32 mmol/L (ref 20–32)
Calcium: 10.1 mg/dL (ref 8.6–10.3)
Chloride: 102 mmol/L (ref 98–110)
Creat: 1.19 mg/dL (ref 0.60–1.35)
GFR, Est African American: 95 mL/min/{1.73_m2} (ref >=60)
GFR, Est Non African American: 82 mL/min/{1.73_m2} (ref >=60)
Globulin: 2.9 g/dL (calc) (ref 1.9–3.7)
Glucose, Bld: 91 mg/dL (ref 65–99)
Potassium: 4.1 mmol/L (ref 3.5–5.3)
Sodium: 140 mmol/L (ref 135–146)
Total Bilirubin: 0.3 mg/dL (ref 0.2–1.2)
Total Protein: 7.7 g/dL (ref 6.1–8.1)

## 2019-07-25 LAB — LIPID PANEL
Cholesterol: 250 mg/dL — ABNORMAL HIGH (ref <200)
HDL: 61 mg/dL (ref >=40)
LDL Cholesterol (Calc): 147 mg/dL (calc) — ABNORMAL HIGH
Non-HDL Cholesterol (Calc): 189 mg/dL (calc) — ABNORMAL HIGH (ref <130)
Total CHOL/HDL Ratio: 4.1 (calc) (ref <5.0)
Triglycerides: 273 mg/dL — ABNORMAL HIGH (ref <150)

## 2019-07-25 LAB — TRICHOMONAS VAGINALIS RNA, QL,MALES: Trichomonas vaginalis RNA: NOT DETECTED

## 2019-07-25 LAB — HEPATITIS C ANTIBODY
Hepatitis C Ab: NONREACTIVE
SIGNAL TO CUT-OFF: 0.01 (ref <1.00)

## 2019-07-25 LAB — HEPATITIS B CORE ANTIBODY, TOTAL: Hep B Core Total Ab: NONREACTIVE

## 2019-07-25 LAB — HIV ANTIBODY (ROUTINE TESTING W REFLEX): HIV 1&2 Ab, 4th Generation: NONREACTIVE

## 2019-07-25 LAB — HEPATITIS B SURFACE ANTIGEN: Hepatitis B Surface Ag: NONREACTIVE

## 2019-12-18 ENCOUNTER — Other Ambulatory Visit: Payer: Self-pay

## 2019-12-18 ENCOUNTER — Encounter: Payer: Self-pay | Admitting: Sports Medicine

## 2019-12-18 ENCOUNTER — Ambulatory Visit (INDEPENDENT_AMBULATORY_CARE_PROVIDER_SITE_OTHER): Payer: 59 | Admitting: Sports Medicine

## 2019-12-18 DIAGNOSIS — R2241 Localized swelling, mass and lump, right lower limb: Secondary | ICD-10-CM

## 2019-12-18 DIAGNOSIS — G5621 Lesion of ulnar nerve, right upper limb: Secondary | ICD-10-CM

## 2019-12-18 NOTE — Assessment & Plan Note (Signed)
He has also noted some tingling in his pinky particularly when gripping his cell phone, not so bad at night or in the morning. No trauma. This is likely due to his ergonomics when gripping his phone, I have asked him to get a new phone case with a knob/pop socket. 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 Follow-up in a month, if persistent discomfort we will proceed with a median nerve hydrodissection at the elbow versus proceeding first with nerve conduction and EMG.

## 2019-12-18 NOTE — Progress Notes (Signed)
    Procedures performed today:    None.  Independent interpretation of notes and tests performed by another provider:   None.  Brief History, Exam, Impression, and Recommendations:    Nodule of skin of right lower leg 1 week ago this pleasant 29 year old male noted a nodule on his mid medial tibial shaft. It has since resolved. I have asked him to just keep an eye on this and if any recurrence he can come back to see me. His exam is benign, he has a negative Homans' sign.  Ulnar neuropathy of right upper extremity He has also noted some tingling in his pinky particularly when gripping his cell phone, not so bad at night or in the morning. No trauma. This is likely due to his ergonomics when gripping his phone, I have asked him to get a new phone case with a knob/pop socket. 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 Follow-up in a month, if persistent discomfort we will proceed with a median nerve hydrodissection at the elbow versus proceeding first with nerve conduction and EMG.    ___________________________________________ Gwen Her. Dianah Field, M.D., ABFM., CAQSM. Primary Care and [place] Instructor of [employer] [facility] of Medicine

## 2019-12-18 NOTE — Assessment & Plan Note (Signed)
1 week ago this pleasant 29 year old male noted a nodule on his mid medial tibial shaft. It has since resolved. I have asked him to just keep an eye on this and if any recurrence he can come back to see me. His exam is benign, he has a negative Homans' sign.

## 2020-01-22 ENCOUNTER — Ambulatory Visit: Payer: 59 | Admitting: Sports Medicine

## 2020-07-22 ENCOUNTER — Encounter: Payer: 59 | Admitting: Osteopathic Medicine

## 2020-10-28 ENCOUNTER — Encounter: Payer: 59 | Admitting: Osteopathic Medicine

## 2020-11-18 ENCOUNTER — Encounter: Payer: 59 | Admitting: Osteopathic Medicine

## 2020-11-19 ENCOUNTER — Encounter: Payer: Self-pay | Admitting: Osteopathic Medicine

## 2021-02-03 ENCOUNTER — Ambulatory Visit (INDEPENDENT_AMBULATORY_CARE_PROVIDER_SITE_OTHER): Payer: 59 | Admitting: Osteopathic Medicine

## 2021-02-03 ENCOUNTER — Encounter: Payer: Self-pay | Admitting: Osteopathic Medicine

## 2021-02-03 VITALS — BP 126/68 | HR 51 | Temp 98.0°F | Wt 204.0 lb

## 2021-02-03 DIAGNOSIS — Z113 Encounter for screening for infections with a predominantly sexual mode of transmission: Secondary | ICD-10-CM | POA: Diagnosis not present

## 2021-02-03 DIAGNOSIS — Z0000 Encounter for general adult medical examination without abnormal findings: Secondary | ICD-10-CM

## 2021-02-03 NOTE — Patient Instructions (Signed)
Since computer update, patient instructions are not coming through or printing properly on the After Visit Summary. Please see attached for patient instructions from today's visit!   

## 2021-02-03 NOTE — Progress Notes (Signed)
Nathan Moreno is a 30 y.o. male who presents to  [place] at [facility]  today, 02/03/21, seeking care for the following:  [place] with other pertinent findings:  The primary encounter diagnosis was Annual physical exam. A diagnosis of Routine screening for STI (sexually transmitted infection) was also pertinent to this visit.   Discussed cutting back on EtOH use (2-3 beers per day) Discussed cutting back / quitting cigars  Age- and risk-appropriate cancer screening discussed - no FH  Patient Instructions  Since computer update, patient instructions are not coming through or printing properly on the After Visit Summary. Please see attached for patient instructions from today's visit!   Orders Placed This Encounter  Procedures   C. trachomatis/N. gonorrhoeae RNA   CBC   COMPLETE METABOLIC PANEL WITH GFR   Lipid panel   HIV antibody (with reflex)   RPR   Hepatitis C antibody    No orders of the defined types were placed in this encounter.  PRINTED FOR PATIENT:   General Preventive Care Most recent routine screening labs: ordered.  Blood pressure goal 130/80 or less.  Tobacco: don't! Please let me know if you need help quitting! Alcohol: responsible moderation is ok for most adults - if you have concerns about your alcohol intake, please talk to me!  Exercise: as tolerated to reduce risk of cardiovascular disease and diabetes. Strength training will also prevent osteoporosis.  Mental health: if need for mental health care (medicines, counseling, other), or concerns about moods, please let me know!  Sexual / Reproductive health: if need for STD testing, or if concerns with libido/pain problems, please let me know! If you need to discuss family planning, please let me know!  Advanced Directive: Living Will and/or Healthcare Power of Attorney recommended for all adults, regardless of age or health.   Vaccines Flu vaccine: for almost everyone, every fall.  Shingles vaccine: after age 65.  Pneumonia vaccines: after age 31 Tetanus booster: every 10 years HPV vaccine: Gardasil up to age 31 to prevent HPV-associated diseases, including certain cancers.  COVID vaccine: THANKS for getting your vaccine! :) / STRONGLY RECOMMENDED  Cancer screenings  Colon cancer screening: for everyone age 35-75 Prostate cancer screening: PSA blood test age 9-71 Lung cancer screening: CT chest every year for those aged 69 to 86 years who have a 20 pack-year smoking history and currently smoke or have quit within the past 15 years  Infection screenings  HIV: recommended screening at least once age 76-65, more often as needed. Gonorrhea/Chlamydia, other STI: screening as needed Hepatitis C: recommended once for everyone age 38-45 TB: certain at-risk populations, or depending on work requirements and/or travel history Other Bone Density Test: recommended for women at age 60, men at age 20, sooner depending on risk factors Abdominal Aortic Aneurysm: screening with ultrasound recommended once for men age 63-75 who have ever smoked        See below for relevant physical exam findings  See below for recent lab and imaging results reviewed  Medications, allergies, PMH, PSH, SocH, [place] reviewed below    Follow-up instructions: Return in about 1 year (around 02/03/2022) for ANNUAL (call week prior to visit for lab orders), SEE [place] SOONER IF NEEDED. CALL/MESSAGE W/ QUESTIONS.  Exam:  BP 126/68 (BP Location: Left Arm, Patient Position: Sitting, Cuff Size: Large)   Pulse (!) 51   Temp 98 F (36.7 C) (Oral)   Wt 204 lb (92.5 kg)   BMI 31.95 kg/m  Constitutional: VS see above. General Appearance: alert, well-developed, well-nourished, NAD Neck: No masses, trachea midline.  Respiratory: Normal respiratory effort. no wheeze, no rhonchi,  no rales Cardiovascular: S1/S2 normal, no murmur, no rub/gallop auscultated. RRR.  Musculoskeletal: Gait normal. Symmetric and independent movement of all extremities Abdominal: non-tender, non-distended, no appreciable organomegaly, neg Murphy's, BS WNLx4 Neurological: Normal balance/coordination. No tremor. Skin: warm, dry, intact.  Psychiatric: Normal judgment/insight. Normal mood and affect. Oriented x3.   No outpatient medications have been marked as taking for the 02/03/21 encounter (Office Visit) with Emeterio Reeve, DO.    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  No results found.     All questions at time of visit were answered - patient instructed to contact office with any additional concerns or updates. ER/RTC precautions were reviewed with the patient as applicable.   Please note: manual typing as well as voice recognition software may have been used to produce this document - typos may escape review. Please contact Dr. Sheppard Coil for any needed clarifications.

## 2021-02-04 LAB — COMPLETE METABOLIC PANEL WITH GFR
AG Ratio: 1.8 (calc) (ref 1.0–2.5)
ALT: 21 U/L (ref 9–46)
AST: 22 U/L (ref 10–40)
Albumin: 4.8 g/dL (ref 3.6–5.1)
Alkaline phosphatase (APISO): 59 U/L (ref 36–130)
BUN: 10 mg/dL (ref 7–25)
CO2: 28 mmol/L (ref 20–32)
Calcium: 10.2 mg/dL (ref 8.6–10.3)
Chloride: 104 mmol/L (ref 98–110)
Creat: 1.12 mg/dL (ref 0.60–1.35)
GFR, Est African American: 101 mL/min/{1.73_m2} (ref >=60)
GFR, Est Non African American: 87 mL/min/{1.73_m2} (ref >=60)
Globulin: 2.6 g/dL (calc) (ref 1.9–3.7)
Glucose, Bld: 89 mg/dL (ref 65–99)
Potassium: 4.6 mmol/L (ref 3.5–5.3)
Sodium: 141 mmol/L (ref 135–146)
Total Bilirubin: 0.4 mg/dL (ref 0.2–1.2)
Total Protein: 7.4 g/dL (ref 6.1–8.1)

## 2021-02-04 LAB — CBC
HCT: 42.1 % (ref 38.5–50.0)
Hemoglobin: 14.4 g/dL (ref 13.2–17.1)
MCH: 31 pg (ref 27.0–33.0)
MCHC: 34.2 g/dL (ref 32.0–36.0)
MCV: 90.7 fL (ref 80.0–100.0)
MPV: 10 fL (ref 7.5–12.5)
Platelets: 226 10*3/uL (ref 140–400)
RBC: 4.64 10*6/uL (ref 4.20–5.80)
RDW: 13.2 % (ref 11.0–15.0)
WBC: 4.1 10*3/uL (ref 3.8–10.8)

## 2021-02-04 LAB — LIPID PANEL
Cholesterol: 220 mg/dL — ABNORMAL HIGH (ref <200)
HDL: 69 mg/dL (ref >=40)
LDL Cholesterol (Calc): 133 mg/dL (calc) — ABNORMAL HIGH
Non-HDL Cholesterol (Calc): 151 mg/dL (calc) — ABNORMAL HIGH (ref <130)
Total CHOL/HDL Ratio: 3.2 (calc) (ref <5.0)
Triglycerides: 84 mg/dL (ref <150)

## 2021-02-04 LAB — HEPATITIS C ANTIBODY
Hepatitis C Ab: NONREACTIVE
SIGNAL TO CUT-OFF: 0.01 (ref <1.00)

## 2021-02-04 LAB — RPR: RPR Ser Ql: NONREACTIVE

## 2021-02-04 LAB — C. TRACHOMATIS/N. GONORRHOEAE RNA
C. trachomatis RNA, TMA: NOT DETECTED
N. gonorrhoeae RNA, TMA: NOT DETECTED

## 2021-02-04 LAB — HIV ANTIBODY (ROUTINE TESTING W REFLEX): HIV 1&2 Ab, 4th Generation: NONREACTIVE

## 2021-03-25 ENCOUNTER — Encounter: Payer: Self-pay | Admitting: Osteopathic Medicine

## 2021-03-25 ENCOUNTER — Ambulatory Visit (INDEPENDENT_AMBULATORY_CARE_PROVIDER_SITE_OTHER): Payer: 59 | Admitting: Osteopathic Medicine

## 2021-03-25 ENCOUNTER — Other Ambulatory Visit: Payer: Self-pay | Admitting: Osteopathic Medicine

## 2021-03-25 VITALS — BP 121/81 | HR 92 | Ht 67.0 in | Wt 205.0 lb

## 2021-03-25 DIAGNOSIS — L989 Disorder of the skin and subcutaneous tissue, unspecified: Secondary | ICD-10-CM

## 2021-03-25 NOTE — Patient Instructions (Signed)
Plan: Await pathology results, I think this is a skin tag / keloid type growth, but would like the lab to analyze to see if this is HPV/wart just to be sure.  Keep area clean and dry as possible. OK to wash with soap, would NOT use alcohol/peroxide. OK to use neosporin for the first few days.  I should have results back in a few days, no more than a week. Let us know if you havent heard back by next Tuesday!

## 2021-03-25 NOTE — Progress Notes (Signed)
Nathan Moreno is a 30 y.o. male who presents to  [place] at [facility]  today, 03/25/21, seeking care for the following:  Penile skin abnormality - small lump on base of shaft of penis. No known STI exposures, has been w/ wife fr years and they are monogamous.     ASSESSMENT & PLAN with other pertinent findings:  The encounter diagnosis was Skin lesion.  On exam, difficult to tell if wart vs benign tag/nodule. Dicsused cryotherapy vs excision and pathology analysis, I think we should send it for definitive diagnosis and patient agreed with this plan. Skin was anesthetized w/ 0.5 cc lidocain and lesion was shaved. Hemostasis was assured w/ pressure and cautery   Patient Instructions  Plan: Await pathology results, I think this is a skin tag / keloid type growth, but would like the lab to analyze to see if this is HPV/wart just to be sure.  Keep area clean and dry as possible. OK to wash with soap, would NOT use alcohol/peroxide. OK to use neosporin for the first few days.  I should have results back in a few days, no more than a week. Let us know if you havent heard back by next Tuesday!  No orders of the defined types were placed in this encounter.   No orders of the defined types were placed in this encounter.    See below for relevant physical exam findings  See below for recent lab and imaging results reviewed  Medications, allergies, PMH, PSH, SocH, FamH reviewed below    Follow-up instructions: Return for RECHECK PENDING RESULTS / IF WORSE OR CHANGE.                                        Exam:  BP 121/81   Pulse 92   Ht '5\' 7"'$  (1.702 m)   Wt 205 lb (93 kg)   BMI 32.11 kg/m  Constitutional: VS see above. General Appearance: alert, well-developed, well-nourished, NAD Neck: No masses, trachea midline.  Respiratory: Normal respiratory effort.  Musculoskeletal: Gait normal.  Skin: see  above Psychiatric: Normal judgment/insight. Normal mood and affect. Oriented x3.   No outpatient medications have been marked as taking for the 03/25/21 encounter (Office Visit) with Emeterio Reeve, DO.    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    Family History  Family history unknown: Yes    Social History   Tobacco Use  Smoking Status Every Day   Types: Cigars  Smokeless Tobacco Never  Tobacco Comments   As per pt, smokes 1 - 2 cigs daily    Past Surgical History:  Procedure Laterality Date   APPENDECTOMY      Immunization History  Administered Date(s) Administered   DTaP 10/22/1993   IPV 10/22/1993   Influenza-Unspecified 07/26/2019    Recent Results (from the past 2160 hour(s))  CBC     Status: None   Collection Time: 02/03/21 12:00 AM  Result Value Ref Range   WBC 4.1 3.8 - 10.8 Thousand/uL   RBC 4.64 4.20 - 5.80 Million/uL   Hemoglobin 14.4 13.2 - 17.1 g/dL   HCT 42.1 38.5 - 50.0 %   MCV 90.7 80.0 - 100.0 fL   MCH 31.0 27.0 -  33.0 pg   MCHC 34.2 32.0 - 36.0 g/dL   RDW 13.2 11.0 - 15.0 %   Platelets 226 140 - 400 Thousand/uL   MPV 10.0 7.5 - 12.5 fL  COMPLETE METABOLIC PANEL WITH GFR     Status: None   Collection Time: 02/03/21 12:00 AM  Result Value Ref Range   Glucose, Bld 89 65 - 99 mg/dL    Comment: .            Fasting reference interval .    BUN 10 7 - 25 mg/dL   Creat 1.12 0.60 - 1.35 mg/dL   GFR, Est Non African American 87 > OR = 60 mL/min/1.22m   GFR, Est African American 101 > OR = 60 mL/min/1.715m  BUN/Creatinine Ratio NOT APPLICABLE 6 - 22 (calc)   Sodium 141 135 - 146 mmol/L   Potassium 4.6 3.5 - 5.3 mmol/L   Chloride 104 98 - 110 mmol/L   CO2 28 20 - 32 mmol/L   Calcium 10.2 8.6 - 10.3 mg/dL   Total Protein 7.4 6.1 - 8.1 g/dL   Albumin 4.8 3.6 - 5.1 g/dL   Globulin 2.6 1.9 - 3.7 g/dL  (calc)   AG Ratio 1.8 1.0 - 2.5 (calc)   Total Bilirubin 0.4 0.2 - 1.2 mg/dL   Alkaline phosphatase (APISO) 59 36 - 130 U/L   AST 22 10 - 40 U/L   ALT 21 9 - 46 U/L  Lipid panel     Status: Abnormal   Collection Time: 02/03/21 12:00 AM  Result Value Ref Range   Cholesterol 220 (H) <200 mg/dL   HDL 69 > OR = 40 mg/dL   Triglycerides 84 <150 mg/dL   LDL Cholesterol (Calc) 133 (H) mg/dL (calc)    Comment: Reference range: <100 . Desirable range <100 mg/dL for primary prevention;   <70 mg/dL for patients with CHD or diabetic patients  with > or = 2 CHD risk factors. . Marland KitchenDL-C is now calculated using the Martin-Hopkins  calculation, which is a validated novel method providing  better accuracy than the Friedewald equation in the  estimation of LDL-C.  MaCresenciano Genret al. JAAnnamaria Helling20MU:[id number]  (http://education.[facility].com/faq/FAQ164)    Total CHOL/HDL Ratio 3.2 <5.0 (calc)   Non-HDL Cholesterol (Calc) 151 (H) <130 mg/dL (calc)    Comment: For patients with diabetes plus 1 major ASCVD risk  factor, treating to a non-HDL-C goal of <100 mg/dL  (LDL-C of <70 mg/dL) is considered a therapeutic  option.   HIV antibody (with reflex)     Status: None   Collection Time: 02/03/21 12:00 AM  Result Value Ref Range   HIV 1&2 Ab, 4th Generation NON-REACTIVE NON-REACTIVE    Comment: HIV-1 antigen and HIV-1/HIV-2 antibodies were not detected. There is no laboratory evidence of HIV infection. . Marland KitchenLEASE NOTE: This information has been disclosed to you from records whose confidentiality may be protected by state law.  If your state requires such protection, then the state law prohibits you from making any further disclosure of the information without the specific written consent of the person to whom it pertains, or as otherwise permitted by law. A general authorization for the release of medical or other information is NOT sufficient for this purpose. . For additional  information please refer to http://education.[facility].com/faq/FAQ106 (This link is being provided for informational/ educational purposes only.) . . Marland Kitchenhe performance of this assay has not been clinically validated in patients less than 2 67ears old. .Marland Kitchen  C. trachomatis/N. gonorrhoeae RNA     Status: None   Collection Time: 02/03/21 12:00 AM  Result Value Ref Range   C. trachomatis RNA, TMA NOT DETECTED NOT DETECTED   N. gonorrhoeae RNA, TMA NOT DETECTED NOT DETECTED    Comment: The analytical performance characteristics of this assay, when used to test SurePath(TM) specimens have been determined by [employer]. The modifications have not been cleared or approved by the FDA. This assay has been validated pursuant to the CLIA regulations and is used for clinical purposes. . For additional information, please refer to https://education.[facility].com/faq/FAQ154 (This link is being provided for information/ educational purposes only.) .   RPR     Status: None   Collection Time: 02/03/21 12:00 AM  Result Value Ref Range   RPR Ser Ql NON-REACTIVE NON-REACTIVE  Hepatitis C antibody     Status: None   Collection Time: 02/03/21 12:00 AM  Result Value Ref Range   Hepatitis C Ab NON-REACTIVE NON-REACTIVE   SIGNAL TO CUT-OFF 0.01 <1.00    Comment: . HCV antibody was non-reactive. There is no laboratory  evidence of HCV infection. . In most cases, no further action is required. However, if recent HCV exposure is suspected, a test for HCV RNA (test code [phone]) is suggested. . For additional information please refer to http://education.[facility].com/faq/FAQ22v1 (This link is being provided for informational/ educational purposes only.) .     No results found.     All questions at time of visit were answered - patient instructed to contact office with any additional concerns or updates. ER/RTC precautions were reviewed with the patient as applicable.    Please note: manual typing as well as voice recognition software may have been used to produce this document - typos may escape review. Please contact Dr. Sheppard Coil for any needed clarifications.

## 2021-04-03 ENCOUNTER — Telehealth: Payer: Self-pay

## 2021-04-03 NOTE — Telephone Encounter (Signed)
Nathan Moreno called and left a message wanting his Derm results.

## 2021-06-02 ENCOUNTER — Telehealth: Payer: Self-pay | Admitting: Family Medicine

## 2021-06-02 NOTE — Telephone Encounter (Signed)
Please call patient and let him know that we finally received the addendum on his pathology report.  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  This can cause external genital warts.  It rarely causes cancer which is reassuring.

## 2021-06-05 NOTE — Telephone Encounter (Signed)
Task completed. Patient advised of results and recommendations, verbalized understanding. No further inquiries during phone call.

## 2022-02-08 NOTE — Progress Notes (Addendum)
Complete physical exam  Patient: Nathan Moreno   DOB: [birth year]   31 y.o. Male  MRN: [id number]  Subjective:    Chief Complaint  Patient presents with   Annual Exam   Transitions Of Care   Nathan Moreno is a 31 y.o. male who presents today for a complete physical exam. He reports consuming a general and restricted due to wife's restrictions  diet. The patient has a physically strenuous job, but has no regular exercise apart from work.  He generally feels well. He reports sleeping well. He does have additional problems to discuss today.   Requesting a referral to audiology for several years of progressive difficulty hearing.   Most recent fall risk assessment:    02/09/2022    9:40 AM  Fall Risk   Falls in the past year? 0  Number falls in past yr: 0  Injury with Fall? 0  Risk for fall due to : No Fall Risks  Follow up Falls evaluation completed   Most recent depression screenings:    02/09/2022    9:40 AM 02/03/2021    1:36 PM  PHQ 2/9 Scores  PHQ - 2 Score 0 0  PHQ- 9 Score 0    Vision:Within last year, Dental: No current dental problems and Receives regular dental care, and STD: The patient reports a past history of: HPV    Patient Care Team: Christen Butter, NP as PCP - General (Nurse Practitioner)   Outpatient Medications Prior to Visit  Medication Sig   Multiple Vitamin (MULTIVITAMIN) tablet Take 1 tablet by mouth daily.   No facility-administered medications prior to visit.   Review of Systems  Constitutional:  Negative for chills, fever, malaise/fatigue and weight loss.  HENT:  Negative for congestion, ear pain, hearing loss, sinus pain and sore throat.   Eyes:  Negative for blurred vision, photophobia and pain.  Respiratory:  Negative for cough, shortness of breath and wheezing.   Cardiovascular:  Negative for chest pain, palpitations and leg swelling.  Gastrointestinal:  Negative for abdominal pain, constipation, diarrhea, heartburn, nausea and vomiting.   Genitourinary:  Negative for dysuria, frequency and urgency.  Musculoskeletal:  Negative for falls and neck pain.  Skin:  Negative for itching and rash.       Pain near his tailbone that comes and goes  Neurological:  Negative for dizziness, weakness and headaches.  Endo/Heme/Allergies:  Negative for polydipsia. Does not bruise/bleed easily.  Psychiatric/Behavioral:  Negative for depression, substance abuse and suicidal ideas. The patient is not nervous/anxious.      Objective:     BP 117/74   Pulse (!) 56   Resp 20   Ht 5\' 7"  (1.702 m)   Wt 211 lb 11.2 oz (96 kg)   SpO2 100%   BMI 33.16 kg/m    Physical Exam Constitutional:      General: He is not in acute distress.    Appearance: Normal appearance. He is not ill-appearing.  HENT:     Head: Normocephalic and atraumatic.     Right Ear: Tympanic membrane normal.     Left Ear: Tympanic membrane normal.     Nose: Nose normal.     Mouth/Throat:     Mouth: Mucous membranes are moist.     Pharynx: No oropharyngeal exudate or posterior oropharyngeal erythema.  Eyes:     Extraocular Movements: Extraocular movements intact.     Conjunctiva/sclera: Conjunctivae normal.     Pupils: Pupils are equal, round, and reactive to light.  Neck:     Thyroid: No thyromegaly.     Vascular: No carotid bruit or JVD.     Trachea: Trachea normal.  Cardiovascular:     Rate and Rhythm: Normal rate and regular rhythm.     Pulses: Normal pulses.     Heart sounds: Normal heart sounds. No murmur heard.    No friction rub. No gallop.  Pulmonary:     Effort: Pulmonary effort is normal. No respiratory distress.     Breath sounds: Normal breath sounds. No wheezing.  Abdominal:     General: Bowel sounds are normal. There is no distension.     Palpations: Abdomen is soft.     Tenderness: There is no abdominal tenderness. There is no guarding.     Hernia: A hernia is present. Hernia is present in the ventral area.  Musculoskeletal:        General:  Normal range of motion.     Cervical back: Normal range of motion and neck supple.  Skin:    General: Skin is warm and dry.       Neurological:     Mental Status: He is alert and oriented to person, place, and time.     Cranial Nerves: No cranial nerve deficit.  Psychiatric:        Mood and Affect: Mood normal.        Behavior: Behavior normal.        Thought Content: Thought content normal.        Judgment: Judgment normal.   No results found for any visits on 02/09/22.     Assessment & Plan:    Routine Health Maintenance and Physical Exam  Immunization History  Administered Date(s) Administered   DTaP 10/22/1993   IPV 10/22/1993   Influenza-Unspecified 07/26/2019    Health Maintenance  Topic Date Due   TETANUS/TDAP  Never done   COVID-19 Vaccine (1) 08/08/2022 (Originally 04/22/1991)   INFLUENZA VACCINE  03/17/2022   Hepatitis C Screening  Completed   HIV Screening  Completed   HPV VACCINES  Aged Out    Discussed health benefits of physical activity, and encouraged him to engage in regular exercise appropriate for his age and condition.  1. Annual physical exam Checking labs as below.  Up-to-date on preventative care.  Wellness information provided with AVS. - Lipid panel - COMPLETE METABOLIC PANEL WITH GFR - CBC with Differential/Platelet  2. Need for Tdap vaccination Discussed recommendations for tetanus immunization.  Declined today but is aware that if there is an issue or injury in the future, this will be recommended for updating.  3. Routine screening for STI (sexually transmitted infection) STI testing as below. - C. trachomatis/N. gonorrhoeae RNA - HIV Antibody (routine testing w rflx) - Hepatitis C Antibody - Hepatitis B surface antigen - RPR  4. Diabetes mellitus screening Checking A1c today. - Hemoglobin A1c  5. Cyst of buttocks Discussed treatment of the cyst involving incision and drainage but possible recurrence.  Patient would like to  decline treatment today as it is not painful or erythematous.  He will consider this in the future.  6. Ventral hernia without obstruction or gangrene Small ventral hernia on assessment.  Discussed monitoring for redness, tenderness, firmness, or significant abdominal pain.  7. Bilateral hearing loss, unspecified Referring to Audiology.  Return in about 1 year (around 02/10/2023) for annual physical exam or sooner if needed.   Christen Butter, NP

## 2022-02-09 ENCOUNTER — Ambulatory Visit (INDEPENDENT_AMBULATORY_CARE_PROVIDER_SITE_OTHER): Payer: 59 | Admitting: Medical-Surgical

## 2022-02-09 ENCOUNTER — Encounter: Payer: Self-pay | Admitting: Medical-Surgical

## 2022-02-09 VITALS — BP 117/74 | HR 56 | Resp 20 | Ht 67.0 in | Wt 211.7 lb

## 2022-02-09 DIAGNOSIS — Z131 Encounter for screening for diabetes mellitus: Secondary | ICD-10-CM | POA: Diagnosis not present

## 2022-02-09 DIAGNOSIS — Z23 Encounter for immunization: Secondary | ICD-10-CM

## 2022-02-09 DIAGNOSIS — Z113 Encounter for screening for infections with a predominantly sexual mode of transmission: Secondary | ICD-10-CM | POA: Diagnosis not present

## 2022-02-09 DIAGNOSIS — K439 Ventral hernia without obstruction or gangrene: Secondary | ICD-10-CM

## 2022-02-09 DIAGNOSIS — H9193 Unspecified hearing loss, bilateral: Secondary | ICD-10-CM

## 2022-02-09 DIAGNOSIS — Z0000 Encounter for general adult medical examination without abnormal findings: Secondary | ICD-10-CM

## 2022-02-09 DIAGNOSIS — L729 Follicular cyst of the skin and subcutaneous tissue, unspecified: Secondary | ICD-10-CM

## 2022-02-10 ENCOUNTER — Encounter: Payer: Self-pay | Admitting: Medical-Surgical

## 2022-02-10 LAB — LIPID PANEL
Non-HDL Cholesterol (Calc): 176 mg/dL (calc) — ABNORMAL HIGH (ref <130)
Total CHOL/HDL Ratio: 3.5 (calc) (ref <5.0)
Triglycerides: 133 mg/dL (ref <150)

## 2022-02-10 LAB — COMPLETE METABOLIC PANEL WITH GFR
BUN: 16 mg/dL (ref 7–25)
Globulin: 2.8 g/dL (calc) (ref 1.9–3.7)
Total Protein: 7.5 g/dL (ref 6.1–8.1)

## 2022-02-11 LAB — HEMOGLOBIN A1C
Hgb A1c MFr Bld: 5.5 % of total Hgb (ref <5.7)
Mean Plasma Glucose: 111 mg/dL
eAG (mmol/L): 6.2 mmol/L

## 2022-02-11 LAB — COMPLETE METABOLIC PANEL WITH GFR
AG Ratio: 1.7 (calc) (ref 1.0–2.5)
ALT: 25 U/L (ref 9–46)
AST: 22 U/L (ref 10–40)
Albumin: 4.7 g/dL (ref 3.6–5.1)
Alkaline phosphatase (APISO): 54 U/L (ref 36–130)
CO2: 27 mmol/L (ref 20–32)
Calcium: 10.1 mg/dL (ref 8.6–10.3)
Chloride: 104 mmol/L (ref 98–110)
Creat: 1.02 mg/dL (ref 0.60–1.26)
Glucose, Bld: 96 mg/dL (ref 65–99)
Potassium: 4.5 mmol/L (ref 3.5–5.3)
Sodium: 139 mmol/L (ref 135–146)
Total Bilirubin: 0.4 mg/dL (ref 0.2–1.2)
eGFR: 100 mL/min/{1.73_m2} (ref >=60)

## 2022-02-11 LAB — CBC WITH DIFFERENTIAL/PLATELET
Absolute Monocytes: 510 cells/uL (ref 200–950)
Basophils Absolute: 29 cells/uL (ref 0–200)
Basophils Relative: 0.6 %
Eosinophils Absolute: 29 cells/uL (ref 15–500)
Eosinophils Relative: 0.6 %
HCT: 40.3 % (ref 38.5–50.0)
Hemoglobin: 13.5 g/dL (ref 13.2–17.1)
Lymphs Abs: 1651 cells/uL (ref 850–3900)
MCH: 30.2 pg (ref 27.0–33.0)
MCHC: 33.5 g/dL (ref 32.0–36.0)
MCV: 90.2 fL (ref 80.0–100.0)
MPV: 9.8 fL (ref 7.5–12.5)
Monocytes Relative: 10.4 %
Neutro Abs: 2680 cells/uL (ref 1500–7800)
Neutrophils Relative %: 54.7 %
Platelets: 221 10*3/uL (ref 140–400)
RBC: 4.47 10*6/uL (ref 4.20–5.80)
RDW: 13.1 % (ref 11.0–15.0)
Total Lymphocyte: 33.7 %
WBC: 4.9 10*3/uL (ref 3.8–10.8)

## 2022-02-11 LAB — RPR: RPR Ser Ql: NONREACTIVE

## 2022-02-11 LAB — C. TRACHOMATIS/N. GONORRHOEAE RNA
C. trachomatis RNA, TMA: NOT DETECTED
N. gonorrhoeae RNA, TMA: NOT DETECTED

## 2022-02-11 LAB — HEPATITIS C ANTIBODY: Hepatitis C Ab: NONREACTIVE

## 2022-02-11 LAB — HEPATITIS B SURFACE ANTIGEN: Hepatitis B Surface Ag: NONREACTIVE

## 2022-02-11 LAB — HIV ANTIBODY (ROUTINE TESTING W REFLEX): HIV 1&2 Ab, 4th Generation: NONREACTIVE

## 2022-02-11 LAB — LIPID PANEL
Cholesterol: 247 mg/dL — ABNORMAL HIGH (ref <200)
HDL: 71 mg/dL (ref >=40)
LDL Cholesterol (Calc): 150 mg/dL (calc) — ABNORMAL HIGH

## 2022-02-23 ENCOUNTER — Other Ambulatory Visit: Payer: Self-pay | Admitting: Medical-Surgical

## 2022-03-24 ENCOUNTER — Telehealth: Payer: Self-pay | Admitting: Audiologist

## 2022-03-24 NOTE — Telephone Encounter (Signed)
Audiology has attempted to contact patient two times. Cannot leave voicemail as the box is full. Please call [phone] to schedule.

## 2023-02-14 NOTE — Progress Notes (Unsigned)
   Complete physical exam  Patient: Nathan Moreno   DOB: 08/04/1991   32 y.o. Male  MRN: [id number]  Subjective:    No chief complaint on file.   Jayland Swartout is a 32 y.o. male who presents today for a complete physical exam. He reports consuming a {diet types:17450} diet. {types:19826} He generally feels {DESC; WELL/FAIRLY WELL/POORLY:18703}. He reports sleeping {DESC; WELL/FAIRLY WELL/POORLY:18703}. He {does/does not:200015} have additional problems to discuss today.    Most recent fall risk assessment:    02/09/2022    9:40 AM  Fall Risk   Falls in the past year? 0  Number falls in past yr: 0  Injury with Fall? 0  Risk for fall due to : No Fall Risks  Follow up Falls evaluation completed     Most recent depression screenings:    02/09/2022    9:40 AM 02/03/2021    1:36 PM  PHQ 2/9 Scores  PHQ - 2 Score 0 0  PHQ- 9 Score 0     {VISON DENTAL STD PSA (Optional):27386}  {History (Optional):23778}  Patient Care Team: Christen Butter, NP as PCP - General (Nurse Practitioner)   Outpatient Medications Prior to Visit  Medication Sig   Multiple Vitamin (MULTIVITAMIN) tablet Take 1 tablet by mouth daily.   No facility-administered medications prior to visit.    ROS        Objective:     There were no vitals taken for this visit. {Vitals History (Optional):23777}  Physical Exam   No results found for any visits on 02/15/23. {Show previous labs (optional):23779}    Assessment & Plan:    Routine Health Maintenance and Physical Exam  Immunization History  Administered Date(s) Administered   DTaP 10/22/1993   IPV 10/22/1993   Influenza-Unspecified 07/26/2019    Health Maintenance  Topic Date Due   COVID-19 Vaccine (1) Never done   DTaP/Tdap/Td (2 - Tdap) 10/20/2008   INFLUENZA VACCINE  03/18/2023   Hepatitis C Screening  Completed   HIV Screening  Completed   HPV VACCINES  Aged Out    Discussed health benefits of physical activity, and encouraged  him to engage in regular exercise appropriate for his age and condition.  Problem List Items Addressed This Visit   None Visit Diagnoses     Annual physical exam    -  Primary   Lipid screening          No follow-ups on file.     Christen Butter, NP

## 2023-02-15 ENCOUNTER — Ambulatory Visit (INDEPENDENT_AMBULATORY_CARE_PROVIDER_SITE_OTHER): Payer: 59 | Admitting: Medical-Surgical

## 2023-02-15 ENCOUNTER — Encounter: Payer: Self-pay | Admitting: Medical-Surgical

## 2023-02-15 VITALS — BP 112/70 | HR 66 | Ht 67.0 in | Wt 209.0 lb

## 2023-02-15 DIAGNOSIS — Z113 Encounter for screening for infections with a predominantly sexual mode of transmission: Secondary | ICD-10-CM

## 2023-02-15 DIAGNOSIS — Z13220 Encounter for screening for lipoid disorders: Secondary | ICD-10-CM

## 2023-02-15 DIAGNOSIS — Z0000 Encounter for general adult medical examination without abnormal findings: Secondary | ICD-10-CM | POA: Diagnosis not present

## 2023-02-16 LAB — COMPLETE METABOLIC PANEL WITH GFR
AG Ratio: 1.8 (calc) (ref 1.0–2.5)
ALT: 46 U/L (ref 9–46)
AST: 34 U/L (ref 10–40)
Albumin: 4.7 g/dL (ref 3.6–5.1)
Alkaline phosphatase (APISO): 64 U/L (ref 36–130)
BUN: 15 mg/dL (ref 7–25)
CO2: 22 mmol/L (ref 20–32)
Calcium: 9.5 mg/dL (ref 8.6–10.3)
Chloride: 107 mmol/L (ref 98–110)
Creat: 1.01 mg/dL (ref 0.60–1.26)
Globulin: 2.6 g/dL (calc) (ref 1.9–3.7)
Glucose, Bld: 102 mg/dL — ABNORMAL HIGH (ref 65–99)
Potassium: 4.3 mmol/L (ref 3.5–5.3)
Sodium: 140 mmol/L (ref 135–146)
Total Bilirubin: 0.2 mg/dL (ref 0.2–1.2)
Total Protein: 7.3 g/dL (ref 6.1–8.1)
eGFR: 101 mL/min/{1.73_m2} (ref >=60)

## 2023-02-16 LAB — CBC WITH DIFFERENTIAL/PLATELET
Absolute Monocytes: 491 cells/uL (ref 200–950)
Basophils Absolute: 18 cells/uL (ref 0–200)
Basophils Relative: 0.4 %
Eosinophils Absolute: 41 cells/uL (ref 15–500)
Eosinophils Relative: 0.9 %
HCT: 39.7 % (ref 38.5–50.0)
Hemoglobin: 13.4 g/dL (ref 13.2–17.1)
Lymphs Abs: 1418 cells/uL (ref 850–3900)
MCH: 30.6 pg (ref 27.0–33.0)
MCHC: 33.8 g/dL (ref 32.0–36.0)
MCV: 90.6 fL (ref 80.0–100.0)
MPV: 9.8 fL (ref 7.5–12.5)
Monocytes Relative: 10.9 %
Neutro Abs: 2534 cells/uL (ref 1500–7800)
Neutrophils Relative %: 56.3 %
Platelets: 238 10*3/uL (ref 140–400)
RBC: 4.38 10*6/uL (ref 4.20–5.80)
RDW: 13.2 % (ref 11.0–15.0)
Total Lymphocyte: 31.5 %
WBC: 4.5 10*3/uL (ref 3.8–10.8)

## 2023-02-16 LAB — LIPID PANEL
Cholesterol: 252 mg/dL — ABNORMAL HIGH (ref <200)
HDL: 73 mg/dL (ref >=40)
LDL Cholesterol (Calc): 152 mg/dL (calc) — ABNORMAL HIGH
Non-HDL Cholesterol (Calc): 179 mg/dL (calc) — ABNORMAL HIGH (ref <130)
Total CHOL/HDL Ratio: 3.5 (calc) (ref <5.0)
Triglycerides: 142 mg/dL (ref <150)

## 2023-02-16 LAB — C. TRACHOMATIS/N. GONORRHOEAE RNA
C. trachomatis RNA, TMA: NOT DETECTED
N. gonorrhoeae RNA, TMA: NOT DETECTED

## 2023-02-16 LAB — HIV ANTIBODY (ROUTINE TESTING W REFLEX): HIV 1&2 Ab, 4th Generation: NONREACTIVE

## 2023-02-16 LAB — RPR: RPR Ser Ql: NONREACTIVE

## 2023-02-16 LAB — HEPATITIS C ANTIBODY: Hepatitis C Ab: NONREACTIVE

## 2023-02-16 LAB — HEPATITIS B SURFACE ANTIGEN: Hepatitis B Surface Ag: NONREACTIVE

## 2023-04-05 ENCOUNTER — Telehealth: Payer: Self-pay | Admitting: Medical-Surgical

## 2023-04-05 NOTE — Telephone Encounter (Deleted)
Refax page 5 option 10

## 2023-04-05 NOTE — Telephone Encounter (Signed)
I have re faxed the paperwork with the fax confirmation from 03/25/2023

## 2023-04-05 NOTE — Telephone Encounter (Addendum)
Patient called in wanting an update on [employer] paperwork, stating that he needed the # of days out of work updated from 5 to 3 days. Please Advise.  Refax page 5 option 10

## 2023-08-30 ENCOUNTER — Telehealth: Payer: Self-pay | Admitting: Medical-Surgical

## 2023-08-30 NOTE — Telephone Encounter (Signed)
 FMLA paperwork in Joy's box

## 2023-09-24 NOTE — Telephone Encounter (Signed)
 Paperwork adjusted.  I cannot justify 3 times a week but I can justify 1-3 times per month for 1 to 5 days per episode.  This has been changed on the paperwork and should be refaxed as such.

## 2023-09-28 NOTE — Telephone Encounter (Signed)
Thank you for all your help!

## 2023-10-05 ENCOUNTER — Encounter: Payer: Self-pay | Admitting: Medical-Surgical

## 2023-10-05 ENCOUNTER — Telehealth (INDEPENDENT_AMBULATORY_CARE_PROVIDER_SITE_OTHER): Payer: 59 | Admitting: Medical-Surgical

## 2023-10-05 DIAGNOSIS — Z0289 Encounter for other administrative examinations: Secondary | ICD-10-CM | POA: Diagnosis not present

## 2023-10-05 NOTE — Progress Notes (Signed)
Virtual Visit via Video Note  I connected with Nathan Moreno on 10/05/23 at  9:10 AM EST by a video enabled telemedicine application and verified that I am speaking with the correct person using two identifiers.   I discussed the limitations of evaluation and management by telemedicine and the availability of in person appointments. The patient expressed understanding and agreed to proceed.  Patient location: home Provider locations: office  Subjective:    CC: Discuss FMLA paperwork.  HPI: Pleasant 33 year old male presenting today via MyChart video visit to discuss FMLA paperwork that was completed.  He has received communication from his HR department that the paperwork needs to reflect the number of episodes that is expected as well as the number of days per episode.  Had difficulty explaining exactly what they were saying and the message that he received.  Paperwork currently filled out for 1-3 episodes per month with 1 to 5 days per episode.  This was sufficient last year however they have new recommended parameters.   Past medical history, Surgical history, Family history not pertinant except as noted below, Social history, Allergies, and medications have been entered into the medical record, reviewed, and corrections made.   Review of Systems: See HPI for pertinent positives and negatives.   Objective:    General: Speaking clearly in complete sentences without any shortness of breath.  Alert and oriented x3.  Normal judgment. No apparent acute distress.  Impression and Recommendations:    1. Encounter for completion of form with patient (Primary) Reviewed paperwork with patient as well as current documented 1-3 episodes per month with 1 to 5 days per episode.  He has a message/screenshot from his HR department that he plans to send to me for updating the paperwork to meet their requirements.  Once this is available, if appropriate, I will make these changes and we will get them  faxed out today.   I discussed the assessment and treatment plan with the patient. The patient was provided an opportunity to ask questions and all were answered. The patient agreed with the plan and demonstrated an understanding of the instructions.   The patient was advised to call back or seek an in-person evaluation if the symptoms worsen or if the condition fails to improve as anticipated.  Return if symptoms worsen or fail to improve.  Thayer Ohm, DNP, APRN, FNP-BC [facility] and Sports Medicine

## 2023-10-05 NOTE — Telephone Encounter (Signed)
 Printed and gave to [employer]

## 2023-10-06 NOTE — Telephone Encounter (Signed)
Paperwork has been modified, dated, and initialed.  Placed in Carol's box to be refaxed.  Please notify patient that this has been completed per the requirements and the screenshot he received from HR.

## 2023-10-08 ENCOUNTER — Telehealth: Payer: 59 | Admitting: Medical-Surgical

## 2023-12-22 ENCOUNTER — Encounter: Payer: Self-pay | Admitting: Medical-Surgical

## 2023-12-22 ENCOUNTER — Ambulatory Visit (INDEPENDENT_AMBULATORY_CARE_PROVIDER_SITE_OTHER): Admitting: Medical-Surgical

## 2023-12-22 VITALS — BP 129/76 | HR 67 | Resp 20 | Ht 67.0 in | Wt 211.0 lb

## 2023-12-22 DIAGNOSIS — J029 Acute pharyngitis, unspecified: Secondary | ICD-10-CM | POA: Diagnosis not present

## 2023-12-22 DIAGNOSIS — Z202 Contact with and (suspected) exposure to infections with a predominantly sexual mode of transmission: Secondary | ICD-10-CM | POA: Diagnosis not present

## 2023-12-22 LAB — POCT RAPID STREP A (OFFICE): Rapid Strep A Screen: NEGATIVE

## 2023-12-22 NOTE — Patient Instructions (Signed)
 Medications & Home Remedies for Upper Respiratory Illness   Note: the following list assumes no pregnancy, normal liver & kidney function and no other drug interactions. Always ask a pharmacist or qualified medical provider if you have any questions!    Aches/Pains, Fever, Headache OTC Acetaminophen (Tylenol) 500 mg tablets - take max 2 tablets (1000 mg) every 6 hours (4 times per day)  OTC Ibuprofen (Motrin) 200 mg tablets - take max 4 tablets (800 mg) every 6 hours*   Sinus Congestion Prescription Atrovent as directed OTC Nasal Saline if desired to rinse OTC Oxymetolazone (Afrin, others) sparing use due to rebound congestion, NEVER use in kids OTC Phenylephrine (Sudafed) 10 mg tablets every 4 hours (or the 12-hour formulation)* OTC Diphenhydramine (Benadryl) 25 mg tablets - take max 2 tablets every 4 hours   Cough & Sore Throat Prescription cough pills or syrups as directed OTC Dextromethorphan (Robitussin, others) - cough suppressant OTC Guaifenesin (Robitussin, Mucinex, others) - expectorant (helps cough up mucus) (Dextromethorphan and Guaifenesin also come in a combination tablet/syrup) OTC Lozenges w/ Benzocaine + Menthol (Cepacol) Honey - as much as you want! Teas which "coat the throat" - look for ingredients Elm Bark, Licorice Root, Marshmallow Root   Other Prescription Oral Steroids to decrease inflammation and improve energy Prescription Antibiotics if these are necessary for bacterial infection - take ALL, even if you're feeling better  OTC Zinc Lozenges within 24 hours of symptoms onset - mixed evidence this shortens the duration of the common cold Don't waste your money on Vitamin C or Echinacea in acute illness - it's already too late!    *Caution in patients with high blood pressure

## 2023-12-22 NOTE — Progress Notes (Signed)
        Established patient visit  History, exam, impression, and plan:  1. Sore throat (Primary) Pleasant 33 year old male presenting today for evaluation of sore throat.  Notes that he was at an event last weekend.  He found out that some of the people he was there with tested positive for strep.  Yesterday afternoon he developed a sore throat and now reports that it is hard to eat or drink anything due to pain with swallowing.  Had some sneezing that started yesterday but no other symptoms.  Is sweating easier today and his regular close than he usually does but is not sure if this is related to his symptoms or not.  He is not coughing and has not run a fever.  On exam, he does have some posterior oropharyngeal edema with erythematous nares and ear canals.  TMs normal.  No maxillary or frontal sinus tenderness.  No lymphadenopathy.  Normal heart and lung evaluation.  Afebrile.  Rapid strep test negative.  Likely viral pharyngitis.  Recommend conservative therapy with salt water gargles, hot/cold fluids, Tylenol , ibuprofen , and throat lozenges.  Discussed the nature of viral etiology and the timeline of 7-14 days for intermittent course.  If symptoms continue to be present after 7-10 days or if his symptoms worsen, advised to reach out for further recommendations. - POCT rapid strep A - GC/Chlamydia Probe Amp([facility])  2. Possible exposure to STI Reports that he has possibly been exposed to STIs.  When asked about the situation, he notes that "I woke up and realized that it was not mine".  He is now concerned about the possibility of contracting an STI and would like full testing.  Offered pharyngeal, rectal, and urine testing for gonorrhea and chlamydia.  Declined rectal testing but was swabbed for pharyngeal GC/chlamydia.  Running full STI profile including urinary evaluation for chlamydia, gonorrhea, and trichomonas.  Advised that he may test negative at this time since it has been so recent but if  he is still concerned or if symptoms develop over the next couple of months, he is welcome to come back for further testing. - STI Profile, CT/NG/TV   Procedures performed this visit: None.  Return if symptoms worsen or fail to improve.  __________________________________ Maryl Snook, DNP, APRN, FNP-BC Primary Care and Sports Medicine [facility] [place]

## 2023-12-25 LAB — STI PROFILE, CT/NG/TV
Chlamydia by NAA: NEGATIVE
Gonococcus by NAA: NEGATIVE
HCV Ab: NONREACTIVE
HIV Screen 4th Generation wRfx: NONREACTIVE
Hep B Core Total Ab: NEGATIVE
Hep B Surface Ab, Qual: NONREACTIVE
Hepatitis B Surface Ag: NEGATIVE
RPR Ser Ql: NONREACTIVE
Trich vag by NAA: NEGATIVE

## 2023-12-25 LAB — HCV INTERPRETATION

## 2023-12-27 ENCOUNTER — Encounter: Payer: Self-pay | Admitting: Medical-Surgical

## 2023-12-27 LAB — GC/CHLAMYDIA PROBE AMP
Chlamydia trachomatis, NAA: NEGATIVE
Neisseria Gonorrhoeae by PCR: NEGATIVE

## 2024-02-21 ENCOUNTER — Encounter: Payer: Self-pay | Admitting: Medical-Surgical

## 2024-02-21 ENCOUNTER — Ambulatory Visit (INDEPENDENT_AMBULATORY_CARE_PROVIDER_SITE_OTHER): Payer: 59 | Admitting: Medical-Surgical

## 2024-02-21 VITALS — BP 118/72 | HR 65 | Resp 20 | Ht 67.0 in | Wt 208.0 lb

## 2024-02-21 DIAGNOSIS — Z720 Tobacco use: Secondary | ICD-10-CM

## 2024-02-21 DIAGNOSIS — E7800 Pure hypercholesterolemia, unspecified: Secondary | ICD-10-CM | POA: Insufficient documentation

## 2024-02-21 DIAGNOSIS — Z0000 Encounter for general adult medical examination without abnormal findings: Secondary | ICD-10-CM

## 2024-02-21 DIAGNOSIS — Z0289 Encounter for other administrative examinations: Secondary | ICD-10-CM

## 2024-02-21 NOTE — Patient Instructions (Signed)
 Preventive Care 76-34 Years Old, Male Preventive care refers to lifestyle choices and visits with your health care provider that can promote health and wellness. Preventive care visits are also called wellness exams. What can I expect for my preventive care visit? Counseling During your preventive care visit, your health care provider may ask about your: Medical history, including: Past medical problems. Family medical history. Current health, including: Emotional well-being. Home life and relationship well-being. Sexual activity. Lifestyle, including: Alcohol, nicotine or tobacco, and drug use. Access to firearms. Diet, exercise, and sleep habits. Safety issues such as seatbelt and bike helmet use. Sunscreen use. Work and work Astronomer. Physical exam Your health care provider may check your: Height and weight. These may be used to calculate your BMI (body mass index). BMI is a measurement that tells if you are at a healthy weight. Waist circumference. This measures the distance around your waistline. This measurement also tells if you are at a healthy weight and may help predict your risk of certain diseases, such as type 2 diabetes and high blood pressure. Heart rate and blood pressure. Body temperature. Skin for abnormal spots. What immunizations do I need?  Vaccines are usually given at various ages, according to a schedule. Your health care provider will recommend vaccines for you based on your age, medical history, and lifestyle or other factors, such as travel or where you work. What tests do I need? Screening Your health care provider may recommend screening tests for certain conditions. This may include: Lipid and cholesterol levels. Diabetes screening. This is done by checking your blood sugar (glucose) after you have not eaten for a while (fasting). Hepatitis B test. Hepatitis C test. HIV (human immunodeficiency virus) test. STI (sexually transmitted infection)  testing, if you are at risk. Talk with your health care provider about your test results, treatment options, and if necessary, the need for more tests. Follow these instructions at home: Eating and drinking  Eat a healthy diet that includes fresh fruits and vegetables, whole grains, lean protein, and low-fat dairy products. Drink enough fluid to keep your urine pale yellow. Take vitamin and mineral supplements as recommended by your health care provider. Do not drink alcohol if your health care provider tells you not to drink. If you drink alcohol: Limit how much you have to 0-2 drinks a day. Know how much alcohol is in your drink. In the [place] one drink equals one 12 oz bottle of beer (355 mL), one 5 oz glass of wine (148 mL), or one 1 oz glass of hard liquor (44 mL). Lifestyle Brush your teeth every morning and night with fluoride toothpaste. Floss one time each day. Exercise for at least 30 minutes 5 or more days each week. Do not use any products that contain nicotine or tobacco. These products include cigarettes, chewing tobacco, and vaping devices, such as e-cigarettes. If you need help quitting, ask your health care provider. Do not use drugs. If you are sexually active, practice safe sex. Use a condom or other form of protection to prevent STIs. Find healthy ways to manage stress, such as: Meditation, yoga, or listening to music. Journaling. Talking to a trusted person. Spending time with friends and family. Minimize exposure to UV radiation to reduce your risk of skin cancer. Safety Always wear your seat belt while driving or riding in a vehicle. Do not drive: If you have been drinking alcohol. Do not ride with someone who has been drinking. If you have been using any mind-altering substances  or drugs. While texting. When you are tired or distracted. Wear a helmet and other protective equipment during sports activities. If you have firearms in your house, make sure you  follow all gun safety procedures. Seek help if you have been physically or sexually abused. What's next? Go to your health care provider once a year for an annual wellness visit. Ask your health care provider how often you should have your eyes and teeth checked. Stay up to date on all vaccines. This information is not intended to replace advice given to you by your health care provider. Make sure you discuss any questions you have with your health care provider. Document Revised: 01/29/2021 Document Reviewed: 01/29/2021 Elsevier Patient Education  2024 [employer].

## 2024-02-21 NOTE — Progress Notes (Signed)
 Complete physical exam  Patient: Nathan Moreno   DOB: 02/05/1991   33 y.o. Male  MRN: [id number]  Subjective:    Chief Complaint  Patient presents with   Annual Exam    Nathan Moreno is a 33 y.o. male who presents today for a complete physical exam. He reports consuming a general diet. Weight lifting several times weekly.  He generally feels well. He reports sleeping well. He does not have additional problems to discuss today.    Most recent fall risk assessment:    02/21/2024    9:49 AM  Fall Risk   Falls in the past year? 0  Number falls in past yr: 0  Injury with Fall? 0  Risk for fall due to : No Fall Risks  Follow up Falls evaluation completed     Most recent depression screenings:    02/21/2024    9:49 AM 02/21/2024    8:56 AM  PHQ 2/9 Scores  PHQ - 2 Score 0 0  PHQ- 9 Score 0    Vision:Within last year and Dental: No current dental problems and Receives regular dental care    Patient Care Team: Willo Mini, NP as PCP - General (Nurse Practitioner)   Outpatient Medications Prior to Visit  Medication Sig   Multiple Vitamin (MULTIVITAMIN) tablet Take 1 tablet by mouth daily.   No facility-administered medications prior to visit.    Review of Systems  Constitutional:  Negative for chills, fever, malaise/fatigue and weight loss.  HENT:  Negative for congestion, ear pain, hearing loss, sinus pain and sore throat.   Eyes:  Negative for blurred vision, photophobia and pain.  Respiratory:  Negative for cough, shortness of breath and wheezing.   Cardiovascular:  Negative for chest pain, palpitations and leg swelling.  Gastrointestinal:  Negative for abdominal pain, constipation, diarrhea, heartburn, nausea and vomiting.  Genitourinary:  Negative for dysuria, frequency and urgency.  Musculoskeletal:  Negative for falls and neck pain.  Skin:  Negative for itching and rash.  Neurological:  Negative for dizziness, weakness and headaches.  Endo/Heme/Allergies:   Negative for polydipsia. Does not bruise/bleed easily.  Psychiatric/Behavioral:  Positive for suicidal ideas. Negative for depression and substance abuse (alcohol use- on weekends). The patient is not nervous/anxious and does not have insomnia.      Objective:    BP 118/72 (BP Location: Right Arm, Cuff Size: Normal)   Pulse 65   Resp 20   Ht 5' 7 (1.702 m)   Wt 208 lb (94.3 kg)   SpO2 98%   BMI 32.58 kg/m    Physical Exam Vitals reviewed.  Constitutional:      General: He is not in acute distress.    Appearance: Normal appearance. He is not ill-appearing.  HENT:     Head: Normocephalic and atraumatic.     Right Ear: Tympanic membrane, ear canal and external ear normal. There is no impacted cerumen.     Left Ear: Tympanic membrane, ear canal and external ear normal. There is no impacted cerumen.     Nose: Nose normal. No congestion or rhinorrhea.     Mouth/Throat:     Mouth: Mucous membranes are moist.     Pharynx: No oropharyngeal exudate or posterior oropharyngeal erythema.  Eyes:     General: No scleral icterus.       Right eye: No discharge.        Left eye: No discharge.     Extraocular Movements: Extraocular movements intact.  Conjunctiva/sclera: Conjunctivae normal.     Pupils: Pupils are equal, round, and reactive to light.  Neck:     Thyroid: No thyromegaly.     Vascular: No carotid bruit or JVD.     Trachea: Trachea normal.  Cardiovascular:     Rate and Rhythm: Normal rate and regular rhythm.     Pulses: Normal pulses.     Heart sounds: Normal heart sounds. No murmur heard.    No friction rub. No gallop.  Pulmonary:     Effort: Pulmonary effort is normal. No respiratory distress.     Breath sounds: Normal breath sounds. No wheezing.  Abdominal:     General: Bowel sounds are normal. There is no distension.     Palpations: Abdomen is soft.     Tenderness: There is no abdominal tenderness. There is no guarding.  Musculoskeletal:        General: Normal  range of motion.     Cervical back: Normal range of motion and neck supple.  Lymphadenopathy:     Cervical: No cervical adenopathy.  Skin:    General: Skin is warm and dry.  Neurological:     Mental Status: He is alert and oriented to person, place, and time.     Cranial Nerves: No cranial nerve deficit.  Psychiatric:        Mood and Affect: Mood normal.        Behavior: Behavior normal.        Thought Content: Thought content normal.        Judgment: Judgment normal.   No results found for any visits on 02/21/24.     Assessment & Plan:    Routine Health Maintenance and Physical Exam  Immunization History  Administered Date(s) Administered   DTaP 10/22/1993   IPV 10/22/1993   Influenza-Unspecified 07/26/2019    Health Maintenance  Topic Date Due   Pneumococcal Vaccine 23-65 Years old (1 of 2 - PCV) Never done   Hepatitis B Vaccines (1 of 3 - 19+ 3-dose series) Never done   HPV VACCINES (1 - 3-dose SCDM series) Never done   COVID-19 Vaccine (1 - 2024-25 season) 03/08/2024 (Originally 04/18/2023)   DTaP/Tdap/Td (2 - Tdap) 12/21/2024 (Originally 10/20/2008)   INFLUENZA VACCINE  03/17/2024   Hepatitis C Screening  Completed   HIV Screening  Completed   Meningococcal B Vaccine  Aged Out    Discussed health benefits of physical activity, and encouraged him to engage in regular exercise appropriate for his age and condition.  1. Annual physical exam (Primary) Checking labs as below.  Up-to-date on preventative care.  Wellness information provided with AVS. - CBC with Differential/Platelet - CMP14+EGFR  2. Tobacco use He is exposed to secondhand smoke but does not actively use tobacco himself.  Aware of recommendation to avoid exposure as much as possible.  3. Hypercholesterolemia Checking labs as below. - Lipid panel - CMP14+EGFR  4. Encounter for completion of form with patient Discussed FMLA forms that we will be coming due soon.  He is content with how they are done  now and does not require any changes.  Once these are available, I will be glad to fill these out without additional fees or appointments.  Return in about 1 year (around 02/20/2025) for annual physical exam or sooner if needed.     Ellawyn Wogan, NP

## 2024-02-22 ENCOUNTER — Ambulatory Visit: Payer: Self-pay | Admitting: Medical-Surgical

## 2024-02-22 LAB — CBC WITH DIFFERENTIAL/PLATELET
Basophils Absolute: 0 x10E3/uL (ref 0.0–0.2)
Basos: 1 %
EOS (ABSOLUTE): 0 x10E3/uL (ref 0.0–0.4)
Eos: 0 %
Hematocrit: 45.1 % (ref 37.5–51.0)
Hemoglobin: 14.8 g/dL (ref 13.0–17.7)
Immature Grans (Abs): 0 x10E3/uL (ref 0.0–0.1)
Immature Granulocytes: 0 %
Lymphocytes Absolute: 1.3 x10E3/uL (ref 0.7–3.1)
Lymphs: 26 %
MCH: 30.3 pg (ref 26.6–33.0)
MCHC: 32.8 g/dL (ref 31.5–35.7)
MCV: 92 fL (ref 79–97)
Monocytes Absolute: 0.5 x10E3/uL (ref 0.1–0.9)
Monocytes: 9 %
Neutrophils Absolute: 3.2 x10E3/uL (ref 1.4–7.0)
Neutrophils: 64 %
Platelets: 233 x10E3/uL (ref 150–450)
RBC: 4.88 x10E6/uL (ref 4.14–5.80)
RDW: 12.7 % (ref 11.6–15.4)
WBC: 5 x10E3/uL (ref 3.4–10.8)

## 2024-02-22 LAB — CMP14+EGFR
ALT: 17 IU/L (ref 0–44)
AST: 20 IU/L (ref 0–40)
Albumin: 4.7 g/dL (ref 4.1–5.1)
Alkaline Phosphatase: 70 IU/L (ref 44–121)
BUN/Creatinine Ratio: 14 (ref 9–20)
BUN: 15 mg/dL (ref 6–20)
Bilirubin Total: 0.3 mg/dL (ref 0.0–1.2)
CO2: 22 mmol/L (ref 20–29)
Calcium: 10 mg/dL (ref 8.7–10.2)
Chloride: 98 mmol/L (ref 96–106)
Creatinine, Ser: 1.07 mg/dL (ref 0.76–1.27)
Globulin, Total: 2.6 g/dL (ref 1.5–4.5)
Glucose: 95 mg/dL (ref 70–99)
Potassium: 4.7 mmol/L (ref 3.5–5.2)
Sodium: 137 mmol/L (ref 134–144)
Total Protein: 7.3 g/dL (ref 6.0–8.5)
eGFR: 93 mL/min/1.73 (ref >59)

## 2024-02-22 LAB — LIPID PANEL
Chol/HDL Ratio: 3.8 ratio (ref 0.0–5.0)
Cholesterol, Total: 259 mg/dL — ABNORMAL HIGH (ref 100–199)
HDL: 68 mg/dL (ref >39)
LDL Chol Calc (NIH): 172 mg/dL — ABNORMAL HIGH (ref 0–99)
Triglycerides: 108 mg/dL (ref 0–149)
VLDL Cholesterol Cal: 19 mg/dL (ref 5–40)

## 2024-04-05 NOTE — Telephone Encounter (Signed)
 Patient came by office this afternoon and got his paperwork, thanks.

## 2024-04-05 NOTE — Telephone Encounter (Signed)
 I'll re-fax it and make a copy to place up front for him to pick up. Can you contact the patient please?

## 2024-04-05 NOTE — Telephone Encounter (Signed)
 Forms have been re-faxed and placed up front. Thank you

## 2024-04-05 NOTE — Telephone Encounter (Signed)
 Niels do you have the copies back there? I did fax them and got confirmation, please advise.    Copied from CRM #[id number]. Topic: General - Other >> Apr 05, 2024  9:17 AM Graeme ORN wrote: Reason for CRM: Patient called. He came by on the 5th to sign his paperwork - FMLA . His job has still not received it. It is due today is the last day. Would like to see if it can be resent and if he can pick up a copy. Would like a call back. Thank You

## 2025-02-26 ENCOUNTER — Encounter: Admitting: Medical-Surgical
